# Patient Record
Sex: Female | Born: 1960 | Race: Black or African American | Hispanic: No | Marital: Married | Smoking: Never smoker
Health system: Southern US, Community
[De-identification: ages and names within clinical notes are randomized; demographics above are authoritative.]

## PROBLEM LIST (undated history)

## (undated) DIAGNOSIS — G6181 Chronic inflammatory demyelinating polyneuritis: Secondary | ICD-10-CM

## (undated) HISTORY — DX: Chronic inflammatory demyelinating polyneuritis: G61.81

---

## 2010-08-18 DIAGNOSIS — G6181 Chronic inflammatory demyelinating polyneuritis: Secondary | ICD-10-CM | POA: Insufficient documentation

## 2010-11-10 DIAGNOSIS — R209 Unspecified disturbances of skin sensation: Secondary | ICD-10-CM | POA: Insufficient documentation

## 2010-11-10 DIAGNOSIS — H532 Diplopia: Secondary | ICD-10-CM | POA: Insufficient documentation

## 2011-02-04 ENCOUNTER — Encounter: Payer: Self-pay | Admitting: Family Medicine

## 2011-02-04 ENCOUNTER — Ambulatory Visit (INDEPENDENT_AMBULATORY_CARE_PROVIDER_SITE_OTHER): Payer: BC Managed Care – PPO | Admitting: Family Medicine

## 2011-02-04 ENCOUNTER — Ambulatory Visit (HOSPITAL_BASED_OUTPATIENT_CLINIC_OR_DEPARTMENT_OTHER)
Admission: RE | Admit: 2011-02-04 | Discharge: 2011-02-04 | Disposition: A | Discharge status: Home / Self Care | Payer: BC Managed Care – PPO | Source: Ambulatory Visit | Attending: Family Medicine | Admitting: Family Medicine

## 2011-02-04 VITALS — BP 100/70 | HR 69 | Temp 98.3°F | Ht 66.0 in | Wt 160.0 lb

## 2011-02-04 DIAGNOSIS — M25572 Pain in left ankle and joints of left foot: Secondary | ICD-10-CM

## 2011-02-04 DIAGNOSIS — M25579 Pain in unspecified ankle and joints of unspecified foot: Secondary | ICD-10-CM

## 2011-02-04 DIAGNOSIS — X58XXXA Exposure to other specified factors, initial encounter: Secondary | ICD-10-CM

## 2011-02-04 DIAGNOSIS — S82899A Other fracture of unspecified lower leg, initial encounter for closed fracture: Secondary | ICD-10-CM

## 2011-02-04 DIAGNOSIS — S8253XA Displaced fracture of medial malleolus of unspecified tibia, initial encounter for closed fracture: Secondary | ICD-10-CM | POA: Insufficient documentation

## 2011-02-04 NOTE — Patient Instructions (Signed)
Your exam and x-rays are consistent with a small avulsion fracture of your medial malleolus (though an accessory ossicle is also a possibility). Start with cam walker for support when up and walking around. Ok to take this off to shower, ice ankle, and to sleep. No weight bearing lower extremity exercises for your left leg for at least the next 3 weeks.  No running, walking, biking, elliptical. Tylenol as needed for pain. Elevate above the level of your heart when possible to help with swelling. Ice the area 15 minutes at a time 3-4 times a day. Follow up with me in 3 weeks for repeat x-rays and exam.

## 2011-02-05 ENCOUNTER — Encounter: Payer: Self-pay | Admitting: Family Medicine

## 2011-02-05 DIAGNOSIS — M25572 Pain in left ankle and joints of left foot: Secondary | ICD-10-CM | POA: Insufficient documentation

## 2011-02-05 NOTE — Assessment & Plan Note (Signed)
x-rays repeated today show possible medial malleolus avulsion fracture.  She is tender at this location but does not recall any acute injury.  No evidence maisonneuve injury.  Given location, should heal well with conservative care - cam walker for support and immobilization and to prevent further injury.  Other possibility is this is an accessory ossicle though typically edges of accessory ossicles are smooth.  Will treat as a true fracture - immobilize for next 3 weeks when ambulatory.  Icing, tylenol/nsaids as needed.  Elevation.  F/u in 3 weeks.  Consider transition to PT or HEP at that time.  See instructions for further.

## 2011-02-05 NOTE — Progress Notes (Signed)
Subjective:    Patient ID: Hannah Bowen, female    DOB: 12/24/1960, 50 y.o.   MRN: [id number]  PCP: Tomma Lightning  HPI 50 yo F here for medial left ankle pain.  Patient denies any known injury. She notes she does have intermittent pains throughout her body that typically come and go related to her chronic inflammatory demyelinating polyneuropathy (seen by neurology, has PICC line and on IV prednisone q3weeks and has done IVIG treatments). About 3 weeks ago she noted to neurologist that she was having medial left ankle pain worse with walking. She thinks this started in early July. Associated with swelling but seemed to improve through July - then mentioned to her neurologist who did x-rays then they called her and told her she had an ankle fracture but received no further treatment (referred to ortho but appointment was for mid-September). Not taking meds or icing for this area. Was wrapping before. No prior injuries to this ankle that she is aware of.  Past Medical History  Diagnosis Date  . Chronic inflammatory demyelinating polyneuropathy     No current outpatient prescriptions on file prior to visit.    Past Surgical History  Procedure Date  . Cesarean section     No Known Allergies  History   Social History  . Marital Status: Married    Spouse Name: N/A    Number of Children: N/A  . Years of Education: N/A   Occupational History  . Not on file.   Social History Main Topics  . Smoking status: Never Smoker   . Smokeless tobacco: Not on file  . Alcohol Use: Not on file  . Drug Use: Not on file  . Sexually Active: Not on file   Other Topics Concern  . Not on file   Social History Narrative  . No narrative on file    Family History  Problem Relation Age of Onset  . Diabetes Father   . Heart attack Father   . Hyperlipidemia Father   . Hypertension Father   . Diabetes Brother   . Hyperlipidemia Brother   . Hypertension Brother     BP 100/70  Pulse  69  Temp(Src) 98.3 F (36.8 C) (Oral)  Ht 5\' 6"  (1.676 m)  Wt 160 lb (72.576 kg)  BMI 25.82 kg/m2  Review of Systems See HPI above.    Objective:   Physical Exam Gen: NAD L ankle: Mild swelling medial ankle.  No bruising, redness, bony deformity. TTP medial malleolus.  No lateral malleolus, base 5th, navicular, achilles, fibular head, other TTP about foot or ankle. FROM ankle with 5/5 strength all motions - has pain on internal rotation though. Negative ant drawer and talar tilt. Negative thompsons. 2+ dp pulse, < 3 sec cap refill.  R ankle: FROM without pain, swelling.    Assessment & Plan:  1. Left ankle pain - x-rays repeated today show possible medial malleolus avulsion fracture.  She is tender at this location but does not recall any acute injury.  No evidence maisonneuve injury.  Given location, should heal well with conservative care - cam walker for support and immobilization and to prevent further injury.  Other possibility is this is an accessory ossicle though typically edges of accessory ossicles are smooth.  Will treat as a true fracture - immobilize for next 3 weeks when ambulatory.  Icing, tylenol/nsaids as needed.  Elevation.  F/u in 3 weeks.  Consider transition to PT or HEP at that time.  See instructions for  further.

## 2011-02-27 ENCOUNTER — Ambulatory Visit (HOSPITAL_BASED_OUTPATIENT_CLINIC_OR_DEPARTMENT_OTHER)
Admission: RE | Admit: 2011-02-27 | Discharge: 2011-02-27 | Disposition: A | Discharge status: Home / Self Care | Payer: 59 | Source: Ambulatory Visit | Attending: Family Medicine | Admitting: Family Medicine

## 2011-02-27 ENCOUNTER — Ambulatory Visit (INDEPENDENT_AMBULATORY_CARE_PROVIDER_SITE_OTHER): Payer: 59 | Admitting: Family Medicine

## 2011-02-27 ENCOUNTER — Encounter: Payer: Self-pay | Admitting: Family Medicine

## 2011-02-27 ENCOUNTER — Ambulatory Visit: Payer: BC Managed Care – PPO | Admitting: Family Medicine

## 2011-02-27 VITALS — BP 107/67 | HR 86 | Temp 98.9°F | Ht 66.0 in | Wt 165.0 lb

## 2011-02-27 DIAGNOSIS — M25572 Pain in left ankle and joints of left foot: Secondary | ICD-10-CM

## 2011-02-27 DIAGNOSIS — M25579 Pain in unspecified ankle and joints of unspecified foot: Secondary | ICD-10-CM

## 2011-02-27 NOTE — Patient Instructions (Signed)
Your x-rays look the same as 3 weeks ago though you are clinically completely healed. This suggests the x-ray finding is an accessory ossicle. Your pain is then most likely from medial ankle sprain or inflammation between medial ankle and the accessory ossicle. Moving forward start theraband exercises and do daily for the next 6 weeks. Avoid heels and walking barefoot for next 4 weeks while you strengthen the muscles that support your ankle. You shouldn't need an ankle brace for the next 4-6 weeks given you're going to be doing straight line exercise (running, swimming). Be careful if you run on trails though. Follow up with me as needed.

## 2011-03-02 ENCOUNTER — Encounter: Payer: Self-pay | Admitting: Family Medicine

## 2011-03-02 NOTE — Assessment & Plan Note (Signed)
x-rays repeated today show no change in bony fragment at medial malleolus.  This suggests this is from an old injury or is an accessory ossicle (typically would have smooth contour if this was an accessory os though).  Her pain then was likely from irritation at this old fracture site or from medial ankle sprain.  Regardless her pain is completely resolved.  Discontinue cam walker.  Start ankle strengthening exercises with theraband.  Activities as tolerated.  Wear supportive shoes for next 2-4 weeks.  F/u prn.

## 2011-03-02 NOTE — Progress Notes (Signed)
Subjective:    Patient ID: Hannah Bowen, female    DOB: 09-23-1960, 50 y.o.   MRN: [id number]  PCP: Tomma Lightning  HPI  50 yo F here for f/u medial left ankle pain.  8/29: Patient denies any known injury. She notes she does have intermittent pains throughout her body that typically come and go related to her chronic inflammatory demyelinating polyneuropathy (seen by neurology, has PICC line and on IV prednisone q3weeks and has done IVIG treatments). About 3 weeks ago she noted to neurologist that she was having medial left ankle pain worse with walking. She thinks this started in early July. Associated with swelling but seemed to improve through July - then mentioned to her neurologist who did x-rays then they called her and told her she had an ankle fracture but received no further treatment (referred to ortho but appointment was for mid-September). Not taking meds or icing for this area. Was wrapping before. No prior injuries to this ankle that she is aware of.  9/21: Patient reports pain has completely resolved since last visit. Still using cam walker but will go about an hour at a time with it off at home. No longer taking tylenol, icing, elevating. No complaints. Past Medical History  Diagnosis Date  . Chronic inflammatory demyelinating polyneuropathy     Current Outpatient Prescriptions on File Prior to Visit  Medication Sig Dispense Refill  . chlorhexidine (PERIDEX) 0.12 % solution       . LYRICA 100 MG capsule       . predniSONE 5 MG/5ML solution Inject 1,000 mg into the vein every 21 ( twenty-one) days.          Past Surgical History  Procedure Date  . Cesarean section     No Known Allergies  History   Social History  . Marital Status: Married    Spouse Name: N/A    Number of Children: N/A  . Years of Education: N/A   Occupational History  . Not on file.   Social History Main Topics  . Smoking status: Never Smoker   . Smokeless tobacco: Not on file    . Alcohol Use: Not on file  . Drug Use: Not on file  . Sexually Active: Not on file   Other Topics Concern  . Not on file   Social History Narrative  . No narrative on file    Family History  Problem Relation Age of Onset  . Diabetes Father   . Heart attack Father   . Hyperlipidemia Father   . Hypertension Father   . Diabetes Brother   . Hyperlipidemia Brother   . Hypertension Brother     BP 107/67  Pulse 86  Temp(Src) 98.9 F (37.2 C) (Oral)  Ht 5\' 6"  (1.676 m)  Wt 165 lb (74.844 kg)  BMI 26.63 kg/m2  Review of Systems  See HPI above.    Objective:   Physical Exam  Gen: NAD L ankle: No swelling medial ankle.  No bruising, redness, bony deformity. No TTP medial malleolus.  No lateral malleolus, base 5th, navicular, achilles, fibular head, other TTP about foot or ankle. FROM ankle with 5/5 strength all motions. Negative ant drawer and talar tilt. Negative thompsons. 2+ dp pulse, < 3 sec cap refill.  R ankle: FROM without pain, swelling.    Assessment & Plan:  1. Left ankle pain - x-rays repeated today show no change in bony fragment at medial malleolus.  This suggests this is from an old injury  or is an accessory ossicle (typically would have smooth contour if this was an accessory os though).  Her pain then was likely from irritation at this old fracture site or from medial ankle sprain.  Regardless her pain is completely resolved.  Discontinue cam walker.  Start ankle strengthening exercises with theraband.  Activities as tolerated.  Wear supportive shoes for next 2-4 weeks.  F/u prn.

## 2011-03-09 DIAGNOSIS — Z79899 Other long term (current) drug therapy: Secondary | ICD-10-CM | POA: Insufficient documentation

## 2013-03-28 ENCOUNTER — Ambulatory Visit (INDEPENDENT_AMBULATORY_CARE_PROVIDER_SITE_OTHER): Payer: 59 | Admitting: Sports Medicine

## 2013-03-28 ENCOUNTER — Encounter: Payer: Self-pay | Admitting: Sports Medicine

## 2013-03-28 VITALS — BP 101/68 | HR 69 | Ht 66.0 in | Wt 145.0 lb

## 2013-03-28 DIAGNOSIS — R269 Unspecified abnormalities of gait and mobility: Secondary | ICD-10-CM

## 2013-03-28 DIAGNOSIS — M7750 Other enthesopathy of unspecified foot: Secondary | ICD-10-CM

## 2013-03-28 DIAGNOSIS — M7741 Metatarsalgia, right foot: Secondary | ICD-10-CM | POA: Insufficient documentation

## 2013-03-28 NOTE — Assessment & Plan Note (Signed)
Metatarsal pads on the right are comfortable but not on the left  Once she completes a marathon I may try to use a smaller metatarsal pad for the left  She should return and we will consider a custom orthotic but also place metatarsal pads in her regular shoes

## 2013-03-28 NOTE — Assessment & Plan Note (Signed)
We used sports insoles and placed scaphoid pad to give her better arch support  On the right foot a metatarsal pad was comfortable  Only left foot metatarsal pad was uncomfortable so we placed it on the undersurface  Her gait and comfort was improved when these were completed

## 2013-03-28 NOTE — Progress Notes (Signed)
Patient ID: Hannah Bowen, female   DOB: 02-Mar-1961, 52 y.o.   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 Does mission trips No known disease or trigger Was hospitalized  6 mos Parlyzed neck down  Started walking 11/2010 Not strong but progressively improved  Still always tingling in stocking distribution None in arms  Exercise program is running Doing [employer] marathon next weekend Normally runs 15 MPW  Physical Exam  NAD  Marked calluses transverse arch Rt > LT Has long arch loss and transverse arch Pronation is minimal 4th MT head down on Rt, calluses at 2-4 and base of 5th MT Kissing callus on dorsum of PIP on 4th toe bilaterally 3rd MT down on Lt, 5th subluxed Good great toe motion, early valgus shift of 1st toes bilat  Leg lengths equal Genu valgus bilat  Mild pronation with walking, more with running bilaterally  Hip abduction strong bilat

## 2013-03-28 NOTE — Patient Instructions (Signed)
Try correction in insoles- if the pad on the underneath side of the left green insole is bothersome, ok to remove it  Please follow up after marathon, bring additional pairs of shoes you wear most frequently for [place] to place padding  Thank you for seeing [place] today!  Good luck and have fun doing your marathon!

## 2014-02-08 DIAGNOSIS — M549 Dorsalgia, unspecified: Secondary | ICD-10-CM | POA: Insufficient documentation

## 2014-05-29 DIAGNOSIS — L249 Irritant contact dermatitis, unspecified cause: Secondary | ICD-10-CM | POA: Insufficient documentation

## 2015-06-07 DIAGNOSIS — E559 Vitamin D deficiency, unspecified: Secondary | ICD-10-CM | POA: Insufficient documentation

## 2018-12-15 ENCOUNTER — Encounter: Payer: Self-pay | Admitting: Neurology

## 2018-12-23 ENCOUNTER — Telehealth: Payer: Self-pay | Admitting: Neurology

## 2018-12-23 NOTE — Telephone Encounter (Signed)
error 

## 2018-12-26 ENCOUNTER — Other Ambulatory Visit (INDEPENDENT_AMBULATORY_CARE_PROVIDER_SITE_OTHER): Payer: 59

## 2018-12-26 ENCOUNTER — Encounter: Payer: Self-pay | Admitting: Neurology

## 2018-12-26 ENCOUNTER — Ambulatory Visit (INDEPENDENT_AMBULATORY_CARE_PROVIDER_SITE_OTHER): Payer: 59 | Admitting: Neurology

## 2018-12-26 ENCOUNTER — Other Ambulatory Visit: Payer: Self-pay

## 2018-12-26 VITALS — BP 131/76 | HR 95 | Ht 66.0 in | Wt 163.0 lb

## 2018-12-26 DIAGNOSIS — G6181 Chronic inflammatory demyelinating polyneuritis: Secondary | ICD-10-CM

## 2018-12-26 DIAGNOSIS — R202 Paresthesia of skin: Secondary | ICD-10-CM

## 2018-12-26 NOTE — Patient Instructions (Addendum)
Reduce prednisone by 20mg  every week, when you are on 20mg  daily, let me know and I will give you instructions to reduce it more slowly.  Check labs  NCS/EMG of the right arm and leg   ELECTROMYOGRAM AND NERVE CONDUCTION STUDIES (EMG/NCS) INSTRUCTIONS  How to Prepare The neurologist conducting the EMG will need to know if you have certain medical conditions. Tell the neurologist and other EMG lab personnel if you: . Have a pacemaker or any other electrical medical device . Take blood-thinning medications . Have hemophilia, a blood-clotting disorder that causes prolonged bleeding Bathing Take a shower or bath shortly before your exam in order to remove oils from your skin. Don't apply lotions or creams before the exam.  What to Expect You'll likely be asked to change into a hospital gown for the procedure and lie down on an examination table. The following explanations can help you understand what will happen during the exam.  . Electrodes. The neurologist or a technician places surface electrodes at various locations on your skin depending on where you're experiencing symptoms. Or the neurologist may insert needle electrodes at different sites depending on your symptoms.  . Sensations. The electrodes will at times transmit a tiny electrical current that you may feel as a twinge or spasm. The needle electrode may cause discomfort or pain that usually ends shortly after the needle is removed. If you are concerned about discomfort or pain, you may want to talk to the neurologist about taking a short break during the exam.  . Instructions. During the needle EMG, the neurologist will assess whether there is any spontaneous electrical activity when the muscle is at rest - activity that isn't present in healthy muscle tissue - and the degree of activity when you slightly contract the muscle.  He or she will give you instructions on resting and contracting a muscle at appropriate times. Depending on  what muscles and nerves the neurologist is examining, he or she may ask you to change positions during the exam.  After your EMG You may experience some temporary, minor bruising where the needle electrode was inserted into your muscle. This bruising should fade within several days. If it persists, contact your primary care doctor.   Your provider has requested that you have labwork completed today. Please go to [facility] ([id number]) on the second floor of this building before leaving the office today. You do not need to check in. If you are not called within 15 minutes please check with the front desk.

## 2018-12-26 NOTE — Progress Notes (Signed)
[facility] Clinic Note - Initial Visit   Date: 12/26/18  Hannah Bowen MRN: [id number] DOB: Aug 18, 1960   Dear Dr. Darron Doom:  Thank you for your kind referral of Hannah Bowen for consultation of CIDP. Although her history is well known to you, please allow Hannah Bowen to reiterate it for the purpose of our medical record. The patient was accompanied to the clinic by husband who also provides collateral information.     History of Present Illness: Hannah Bowen is a 58 y.o. right-handed female with history of CIDP (diagnosed 2011) presenting for evaluation of paresthesias.   In October 2011, she developed upper respiratory infection and few days later developed numbness and tingling in the fingers.  Over the following week, numbness and weakness involved her legs which prompted her to go to the emergency department.  She was seen at [facility] and diagnosed with AIDP via lumbar puncture (W2 R0 G77* P52*).  She was treated with IVIG and improved over a week.  However, a week following her discharge, she was readmitted with worsening weakness, requiring ICU admission due to severe weakness in the extremities and diplopia.  She did not have problems with speech, breathing, or autonomic instability.  She was then treated with plasmapheresis and discharged to inpatient rehab.  Over the following 2 months, she had 4 relapses and received a total of 2 cycles of IVIG and 3 cycles of plasmapheresis.  She underwent a second LP and muscle and left sural nerve biopsy which was consistent with CIDP.  In January 2012 -October 2012, she was started on maintenance therapy with Solu-Medrol. By June 2013, she was about 80% back to her baseline and she was able to start travelling again. Over the years, she has mild residual imbalance, generalized weakness, and mild paresthesias, but was able to get back to her normal level of activity.  She was a being followed by Dr. Desiree Hane, Neuromuscular  neurologist at [facility].  She moved to [place] in July 2012 and established care with Dr. Harvie Bridge at [facility].  She was doing well and did not have any further follow-up or treatment. For pain she has tried gabapentin, Lyrica, and amitriptyline and currently not taking anything.   Starting around June 2020, she began having increased ingling sensation of the feet, hands, and face.  She has significant fatigue.  She did not have any preceding infection. She walks unassisted and has not suffered any falls.  She has not done any recent physical. She teaches swimming lessons. She takes prednisone as "emergency".  She took a week long course in 2015, which was prescribed by her PCP.  Because of her new symptoms, she again started prednisone 33m/d for the past 20-days.  Despite taking this, she has not experienced any improvement in her paresthesias.       She is self-employed.  She is vegetarian. No recent illness.  [place]   Past Medical History:  Diagnosis Date   Chronic inflammatory demyelinating polyneuropathy ([facility]     Past Surgical History:  Procedure Laterality Date   CESAREAN SECTION       Medications:  Outpatient Encounter Medications as of 12/26/2018  Medication Sig   predniSONE (DELTASONE) 20 MG tablet Take 60 mg by mouth daily. Takes 651mdaily   No facility-administered encounter medications on file as of 12/26/2018.     Allergies: No Known Allergies  Family History: Family History  Problem Relation Age of Onset  Diabetes Father    Heart attack Father    Hyperlipidemia Father    Hypertension Father    Diabetes Brother    Hyperlipidemia Brother    Hypertension Brother     Social History: Social History   Tobacco Use   Smoking status: Never Smoker   Smokeless tobacco: Never Used  Substance Use Topics   Alcohol use: Not Currently   Drug use: Never   Social History   Social History Narrative   Right  handed, two story home, stairs, 6 total lives in her home    Review of Systems:  CONSTITUTIONAL: No fevers, chills, night sweats, or weight loss.   EYES: No visual changes or eye pain ENT: No hearing changes.  No history of nose bleeds.   RESPIRATORY: No cough, wheezing and shortness of breath.   CARDIOVASCULAR: Negative for chest pain, and palpitations.   GI: Negative for abdominal discomfort, blood in stools or black stools.  No recent change in bowel habits.   GU:  No history of incontinence.   MUSCLOSKELETAL: No history of joint pain or swelling.  No myalgias.   SKIN: Negative for lesions, rash, and itching.   HEMATOLOGY/ONCOLOGY: Negative for prolonged bleeding, bruising easily, and swollen nodes.  No history of cancer.   ENDOCRINE: Negative for cold or heat intolerance, polydipsia or goiter.   PSYCH:  No depression or anxiety symptoms.   NEURO: As Above.   Vital Signs:  BP 131/76    Pulse 95    Ht 5' 6"  (1.676 m)    Wt 163 lb (73.9 kg)    SpO2 97%    BMI 26.31 kg/m    General Medical Exam:   General:  Well appearing, comfortable.   Eyes/ENT: see cranial nerve examination.   Neck:   No carotid bruits. Respiratory:  Clear to auscultation, good air entry bilaterally.   Cardiac:  Regular rate and rhythm, no murmur.   Extremities:  No deformities, edema, or skin discoloration.  Skin:  No rashes or lesions. Scar over the distal left leg/foot from prior muscle and sural nerve biopsy.  Neurological Exam: MENTAL STATUS including orientation to time, place, person, recent and remote memory, attention span and concentration, language, and fund of knowledge is normal.  Speech is not dysarthric.  CRANIAL NERVES: II:  No visual field defects.   III-IV-VI: Pupils equal round and reactive to light.  Normal conjugate, extra-ocular eye movements in all directions of gaze.  No nystagmus.  No ptosis.   V:  Normal facial sensation.    VII:  Normal facial symmetry and movements.   VIII:   Normal hearing and vestibular function.   IX-X:  Normal palatal movement.   XI:  Normal shoulder shrug and head rotation.   XII:  Normal tongue strength and range of motion, no deviation or fasciculation.  MOTOR:  No atrophy, fasciculations or abnormal movements.  No pronator drift.   Upper Extremity:  Right  Left  Deltoid  5/5   5/5   Biceps  5/5   5/5   Triceps  5/5   5/5   Infraspinatus 5/5  5/5  Medial pectoralis 5/5  5/5  Wrist extensors  5/5   5/5   Wrist flexors  5/5   5/5   Finger extensors  5/5   5/5   Finger flexors  5/5   5/5   Dorsal interossei  5/5   5/5   Abductor pollicis  5/5   5/5   Tone (Ashworth scale)  0  0   Lower Extremity:  Right  Left  Hip flexors  5/5   5/5   Hip extensors  5/5   5/5   Adductor 5/5  5/5  Abductor 5/5  5/5  Knee flexors  5/5   5/5   Knee extensors  5/5   5/5   Dorsiflexors  5/5   5/5   Plantarflexors  5/5   5/5   Toe extensors  5/5   5/5   Toe flexors  5/5   5/5   Tone (Ashworth scale)  0  0   MSRs:  Right        Left                  brachioradialis 2+  2+  biceps 2+  2+  triceps 2+  2+  patellar tr  tr  ankle jerk 0  0  Hoffman no  no  plantar response down  down   SENSORY:  Normal and symmetric perception of light touch, pinprick, vibration, and proprioception.  Mild sway with Romberg's sign.   COORDINATION/GAIT: Normal finger-to- nose-finger and heel-to-shin.  Intact rapid alternating movements bilaterally. Gait narrow based and stable. Stressed gait intact. Mild unsteadiness with tandem gait.    IMPRESSION: Chronic inflammatory demyelinating polyradiculoneuropathy, diagnosed in 2011, and treated with series of IVIG (minimal benefit), plasmapheresis (some benefit), and Solumedrol (greatest benefit).  She was on maintenance IVMP from Jan 2012 - Oct 2012 and has made an impressive recovery.  Over the past month, she began experiencing generalized numbness/tingling concerning for relapse of CIDP and was started on prednisone  51m/d. She has not appreciated any significant benefit.   Her exam today is remarkably intact, except for absent reflexes in the legs and mild sensory ataxia.  Strength and sensation is normal throughout. I will plan to obtain NCS/EMG of the right arm and leg to assess for demyelinating changes.    Management options were discussed and I do not favor treating CIDP with prednisone and will taper her off this by reducing by 235mevery week, and slowed once she is on 2079m.  Instead, if there is evidence of demyelinating polyradiculoneuropathy on EDX, I will start monthly methylprednisolone.  She will also be screened for other causes of paresthesias with laboratory testing as follows:  ESR, CRP, vitamin B12, folate, vitamin B1, MMA, copper, TSH, SPEP with IFE, and CMP.  Further recommendations pending results.  Greater than 50% of this 60 minute visit was spent in counseling, explanation of diagnosis, planning of further management, and coordination of care due to the nature of their.   Thank you for allowing me to participate in patient's care.  If I can answer any additional questions, I would be pleased to do so.    Sincerely,    Cleavon Goldman K. PatPosey ProntoO

## 2018-12-27 ENCOUNTER — Other Ambulatory Visit: Payer: Self-pay

## 2018-12-27 ENCOUNTER — Telehealth: Payer: Self-pay

## 2018-12-27 DIAGNOSIS — R202 Paresthesia of skin: Secondary | ICD-10-CM

## 2018-12-27 DIAGNOSIS — G6181 Chronic inflammatory demyelinating polyneuritis: Secondary | ICD-10-CM

## 2018-12-27 LAB — B12 AND FOLATE PANEL
Folate: 9.9 ng/mL
Vitamin B-12: 381 pg/mL (ref 200–1100)

## 2018-12-27 NOTE — Telephone Encounter (Signed)
-----   Message from Alda Berthold, DO sent at 12/27/2018  9:34 AM EDT ----- Please call the lab and see if they can add free T3 and free T4 to her labs from yesterday. Thanks.

## 2018-12-27 NOTE — Telephone Encounter (Signed)
Added labs to exisiting order

## 2018-12-31 LAB — COMPLETE METABOLIC PANEL WITH GFR
AG Ratio: 1.3 (calc) (ref 1.0–2.5)
ALT: 20 U/L (ref 6–29)
AST: 11 U/L (ref 10–35)
Albumin: 4.2 g/dL (ref 3.6–5.1)
Alkaline phosphatase (APISO): 51 U/L (ref 37–153)
BUN: 13 mg/dL (ref 7–25)
CO2: 25 mmol/L (ref 20–32)
Calcium: 9.5 mg/dL (ref 8.6–10.4)
Chloride: 104 mmol/L (ref 98–110)
Creat: 0.78 mg/dL (ref 0.50–1.05)
GFR, Est African American: 97 mL/min/{1.73_m2} (ref >=60)
GFR, Est Non African American: 84 mL/min/{1.73_m2} (ref >=60)
Globulin: 3.2 g/dL (calc) (ref 1.9–3.7)
Glucose, Bld: 132 mg/dL — ABNORMAL HIGH (ref 65–99)
Potassium: 4.1 mmol/L (ref 3.5–5.3)
Sodium: 140 mmol/L (ref 135–146)
Total Bilirubin: 0.3 mg/dL (ref 0.2–1.2)
Total Protein: 7.4 g/dL (ref 6.1–8.1)

## 2018-12-31 LAB — IMMUNOFIXATION ELECTROPHORESIS
IgG (Immunoglobin G), Serum: 1471 mg/dL (ref 600–1640)
IgM, Serum: 34 mg/dL — ABNORMAL LOW (ref 50–300)
Immunofix Electr Int: NOT DETECTED
Immunoglobulin A: 195 mg/dL (ref 47–310)

## 2018-12-31 LAB — COPPER, SERUM: Copper: 119 ug/dL (ref 70–175)

## 2018-12-31 LAB — PROTEIN ELECTROPHORESIS, SERUM
Albumin ELP: 4.1 g/dL (ref 3.8–4.8)
Alpha 1: 0.3 g/dL (ref 0.2–0.3)
Alpha 2: 0.8 g/dL (ref 0.5–0.9)
Beta 2: 0.4 g/dL (ref 0.2–0.5)
Beta Globulin: 0.5 g/dL (ref 0.4–0.6)
Gamma Globulin: 1.4 g/dL (ref 0.8–1.7)
Total Protein: 7.4 g/dL (ref 6.1–8.1)

## 2018-12-31 LAB — TEST AUTHORIZATION

## 2018-12-31 LAB — VITAMIN B1

## 2018-12-31 LAB — T3, FREE: T3, Free: 2.6 pg/mL (ref 2.3–4.2)

## 2018-12-31 LAB — SEDIMENTATION RATE: Sed Rate: 19 mm/h (ref 0–30)

## 2018-12-31 LAB — METHYLMALONIC ACID, SERUM: Methylmalonic Acid, Quant: 176 nmol/L (ref 87–318)

## 2018-12-31 LAB — T4, FREE: Free T4: 1.1 ng/dL (ref 0.8–1.8)

## 2018-12-31 LAB — TSH: TSH: 0.16 mIU/L — ABNORMAL LOW (ref 0.40–4.50)

## 2018-12-31 LAB — C-REACTIVE PROTEIN: CRP: 0.7 mg/L (ref <8.0)

## 2019-01-03 NOTE — Telephone Encounter (Signed)
Plan to start Solumedrol 1g x 5 days, followed by monthly infusions.

## 2019-01-04 ENCOUNTER — Other Ambulatory Visit: Payer: Self-pay

## 2019-01-04 DIAGNOSIS — G6181 Chronic inflammatory demyelinating polyneuritis: Secondary | ICD-10-CM

## 2019-01-04 LAB — VITAMIN B1: Vitamin B1 (Thiamine): 21 nmol/L (ref 8–30)

## 2019-01-04 NOTE — Progress Notes (Signed)
Patient is scheduled for Monday 01/09/2019 for treatment for solumedrol orders at patient care center [phone], patient notified of this and orders placed

## 2019-01-09 ENCOUNTER — Ambulatory Visit (HOSPITAL_COMMUNITY)
Admission: RE | Admit: 2019-01-09 | Discharge: 2019-01-09 | Disposition: A | Discharge status: Home / Self Care | Payer: 59 | Source: Ambulatory Visit | Attending: Internal Medicine | Admitting: Internal Medicine

## 2019-01-09 ENCOUNTER — Other Ambulatory Visit: Payer: Self-pay

## 2019-01-09 DIAGNOSIS — G6181 Chronic inflammatory demyelinating polyneuritis: Secondary | ICD-10-CM | POA: Diagnosis not present

## 2019-01-09 MED ORDER — SODIUM CHLORIDE 0.9 % IV SOLN
1000.0000 mg | Freq: Every day | INTRAVENOUS | Status: DC
  Administered 2019-01-09: 1000 mg via INTRAVENOUS
  Filled 2019-01-09: qty 1000

## 2019-01-09 MED ORDER — SODIUM CHLORIDE 0.9 % IV SOLN
1000.0000 mg | INTRAVENOUS | Status: DC
  Filled 2019-01-09: qty 1000

## 2019-01-09 MED ORDER — SODIUM CHLORIDE 0.9 % IV SOLN
INTRAVENOUS | Status: DC | PRN
  Administered 2019-01-09: 10:00:00 250 mL via INTRAVENOUS

## 2019-01-09 MED ORDER — SODIUM CHLORIDE 0.9 % IV SOLN: 1000.0000 mg | Freq: Every day | INTRAVENOUS | Status: DC

## 2019-01-09 NOTE — Progress Notes (Signed)
Placed new orders for solu-medrol 1g. Contacted Patient care.

## 2019-01-09 NOTE — Discharge Instructions (Signed)
Methylprednisolone Solution for Injection What is this medicine? METHYLPREDNISOLONE (meth ill pred NISS oh lone) is a corticosteroid. It is commonly used to treat inflammation of the skin, joints, lungs, and other organs. Common conditions treated include asthma, allergies, and arthritis. It is also used for other conditions, such as blood disorders and diseases of the adrenal glands. This medicine may be used for other purposes; ask your health care provider or pharmacist if you have questions. COMMON BRAND NAME(S): A-Methapred, Solu-Medrol What should I tell my health care provider before I take this medicine? They need to know if you have any of these conditions:  Cushing's syndrome  eye disease, vision problems  diabetes  glaucoma  heart disease  high blood pressure  infection (especially a virus infection such as chickenpox, cold sores, or herpes)  liver disease  mental illness  myasthenia gravis  osteoporosis  recently received or scheduled to receive a vaccine  seizures  stomach or intestine problems  thyroid disease  an unusual or allergic reaction to lactose, methylprednisolone, other medicines, foods, dyes, or preservatives  pregnant or trying to get pregnant  breast-feeding How should I use this medicine? This medicine is for injection or infusion into a vein. It is also for injection into a muscle. It is given by a health care professional in a hospital or clinic setting. Talk to your pediatrician regarding the use of this medicine in children. While this drug may be prescribed for selected conditions, precautions do apply. Overdosage: If you think you have taken too much of this medicine contact a poison control center or emergency room at once. NOTE: This medicine is only for you. Do not share this medicine with others. What if I miss a dose? This does not apply. What may interact with this medicine? Do not take this medicine with any of the following  medications:  alefacept  echinacea  iopamidol  live virus vaccines  metyrapone  mifepristone This medicine may also interact with the following medications:  amphotericin B  aspirin and aspirin-like medicines  certain antibiotics like erythromycin, clarithromycin, troleandomycin  certain medicines for diabetes  certain medicines for fungal infection like ketoconazole  certain medicines for seizures like carbamazepine, phenobarbital, phenytoin  certain medicines that treat or prevent blood clots like warfarin  cyclosporine  digoxin  diuretics  female hormones, like estrogens and birth control pills  isoniazid  NSAIDS, medicines for pain and inflammation, like ibuprofen or naproxen  other medicines for myasthenia gravis  rifampin  vaccines This list may not describe all possible interactions. Give your health care provider a list of all the medicines, herbs, non-prescription drugs, or dietary supplements you use. Also tell them if you smoke, drink alcohol, or use illegal drugs. Some items may interact with your medicine. What should I watch for while using this medicine? Tell your doctor or healthcare professional if your symptoms do not start to get better or if they get worse. Do not stop taking except on your doctor's advice. You may develop a severe reaction. Your doctor will tell you how much medicine to take. Your condition will be monitored carefully while you are receiving this medicine. This medicine may increase your risk of getting an infection. Tell your doctor or health care professional if you are around anyone with measles or chickenpox, or if you develop sores or blisters that do not heal properly. This medicine may increase blood sugar. Ask your healthcare provider if changes in diet or medicines are needed if you have diabetes. Tell   your doctor or health care professional right away if you have any change in your eyesight. Using this medicine for a  long time may increase your risk of low bone mass. Talk to your doctor about bone health. What side effects may I notice from receiving this medicine? Side effects that you should report to your doctor or health care professional as soon as possible:  allergic reactions like skin rash, itching or hives, swelling of the face, lips, or tongue  bloody or tarry stools  hallucination, loss of contact with reality  muscle cramps  muscle pain  palpitations  signs and symptoms of high blood sugar such as being more thirsty or hungry or having to urinate more than normal. You may also feel very tired or have blurry vision.  signs and symptoms of infection like fever or chills; cough; sore throat; pain or trouble passing urine  trouble passing urine Side effects that usually do not require medical attention (report to your doctor or health care professional if they continue or are bothersome):  changes in emotions or mood  constipation  diarrhea  excessive hair growth on the face or body  headache  nausea, vomiting  pain, redness, or irritation at site where injected  trouble sleeping  weight gain This list may not describe all possible side effects. Call your doctor for medical advice about side effects. You may report side effects to FDA at 1-800-FDA-1088. Where should I keep my medicine? This drug is given in a hospital or clinic and will not be stored at home. NOTE: This sheet is a summary. It may not cover all possible information. If you have questions about this medicine, talk to your doctor, pharmacist, or health care provider.  2020 Elsevier/Gold Standard (2018-02-24 09:12:19)  

## 2019-01-09 NOTE — Progress Notes (Signed)
Patient received methyl Predinsolone (Solumedrol) via  PIV. Rate per hour was reduced per patient request ( 71ml /hr). Tolerated well, vitals stable, discharge instructions given, verbalized understanding. Patient alert, oriented and ambulatory at the time of discharge.

## 2019-01-10 ENCOUNTER — Ambulatory Visit (HOSPITAL_COMMUNITY)
Admission: RE | Admit: 2019-01-10 | Discharge: 2019-01-10 | Disposition: A | Discharge status: Home / Self Care | Payer: 59 | Source: Ambulatory Visit | Attending: Internal Medicine | Admitting: Internal Medicine

## 2019-01-10 DIAGNOSIS — G6181 Chronic inflammatory demyelinating polyneuritis: Secondary | ICD-10-CM | POA: Diagnosis not present

## 2019-01-10 MED ORDER — SODIUM CHLORIDE 0.9 % IV SOLN
1000.0000 mg | Freq: Every day | INTRAVENOUS | Status: DC
  Filled 2019-01-10: qty 8

## 2019-01-10 MED ORDER — SODIUM CHLORIDE 0.9 % IV SOLN
1000.0000 mg | Freq: Every day | INTRAVENOUS | Status: DC
  Administered 2019-01-10: 1000 mg via INTRAVENOUS
  Filled 2019-01-10 (×2): qty 8

## 2019-01-10 MED ORDER — SODIUM CHLORIDE 0.9 % IV SOLN
INTRAVENOUS | Status: DC | PRN
  Administered 2019-01-10: 09:00:00 250 mL via INTRAVENOUS

## 2019-01-10 NOTE — Progress Notes (Signed)
Patient received methyl Predninsolone (Solu-medrol) via PIV. Tolerated well, vitals stable, discharge instructions given, verbalized understanding. Patient alert, oriented and ambulatory at the time of discharge.

## 2019-01-10 NOTE — Discharge Instructions (Signed)
Methylprednisolone Solution for Injection What is this medicine? METHYLPREDNISOLONE (meth ill pred NISS oh lone) is a corticosteroid. It is commonly used to treat inflammation of the skin, joints, lungs, and other organs. Common conditions treated include asthma, allergies, and arthritis. It is also used for other conditions, such as blood disorders and diseases of the adrenal glands. This medicine may be used for other purposes; ask your health care provider or pharmacist if you have questions. COMMON BRAND NAME(S): A-Methapred, Solu-Medrol What should I tell my health care provider before I take this medicine? They need to know if you have any of these conditions:  Cushing's syndrome  eye disease, vision problems  diabetes  glaucoma  heart disease  high blood pressure  infection (especially a virus infection such as chickenpox, cold sores, or herpes)  liver disease  mental illness  myasthenia gravis  osteoporosis  recently received or scheduled to receive a vaccine  seizures  stomach or intestine problems  thyroid disease  an unusual or allergic reaction to lactose, methylprednisolone, other medicines, foods, dyes, or preservatives  pregnant or trying to get pregnant  breast-feeding How should I use this medicine? This medicine is for injection or infusion into a vein. It is also for injection into a muscle. It is given by a health care professional in a hospital or clinic setting. Talk to your pediatrician regarding the use of this medicine in children. While this drug may be prescribed for selected conditions, precautions do apply. Overdosage: If you think you have taken too much of this medicine contact a poison control center or emergency room at once. NOTE: This medicine is only for you. Do not share this medicine with others. What if I miss a dose? This does not apply. What may interact with this medicine? Do not take this medicine with any of the following  medications:  alefacept  echinacea  iopamidol  live virus vaccines  metyrapone  mifepristone This medicine may also interact with the following medications:  amphotericin B  aspirin and aspirin-like medicines  certain antibiotics like erythromycin, clarithromycin, troleandomycin  certain medicines for diabetes  certain medicines for fungal infection like ketoconazole  certain medicines for seizures like carbamazepine, phenobarbital, phenytoin  certain medicines that treat or prevent blood clots like warfarin  cyclosporine  digoxin  diuretics  female hormones, like estrogens and birth control pills  isoniazid  NSAIDS, medicines for pain and inflammation, like ibuprofen or naproxen  other medicines for myasthenia gravis  rifampin  vaccines This list may not describe all possible interactions. Give your health care provider a list of all the medicines, herbs, non-prescription drugs, or dietary supplements you use. Also tell them if you smoke, drink alcohol, or use illegal drugs. Some items may interact with your medicine. What should I watch for while using this medicine? Tell your doctor or healthcare professional if your symptoms do not start to get better or if they get worse. Do not stop taking except on your doctor's advice. You may develop a severe reaction. Your doctor will tell you how much medicine to take. Your condition will be monitored carefully while you are receiving this medicine. This medicine may increase your risk of getting an infection. Tell your doctor or health care professional if you are around anyone with measles or chickenpox, or if you develop sores or blisters that do not heal properly. This medicine may increase blood sugar. Ask your healthcare provider if changes in diet or medicines are needed if you have diabetes. Tell   your doctor or health care professional right away if you have any change in your eyesight. Using this medicine for a  long time may increase your risk of low bone mass. Talk to your doctor about bone health. What side effects may I notice from receiving this medicine? Side effects that you should report to your doctor or health care professional as soon as possible:  allergic reactions like skin rash, itching or hives, swelling of the face, lips, or tongue  bloody or tarry stools  hallucination, loss of contact with reality  muscle cramps  muscle pain  palpitations  signs and symptoms of high blood sugar such as being more thirsty or hungry or having to urinate more than normal. You may also feel very tired or have blurry vision.  signs and symptoms of infection like fever or chills; cough; sore throat; pain or trouble passing urine  trouble passing urine Side effects that usually do not require medical attention (report to your doctor or health care professional if they continue or are bothersome):  changes in emotions or mood  constipation  diarrhea  excessive hair growth on the face or body  headache  nausea, vomiting  pain, redness, or irritation at site where injected  trouble sleeping  weight gain This list may not describe all possible side effects. Call your doctor for medical advice about side effects. You may report side effects to FDA at 1-800-FDA-1088. Where should I keep my medicine? This drug is given in a hospital or clinic and will not be stored at home. NOTE: This sheet is a summary. It may not cover all possible information. If you have questions about this medicine, talk to your doctor, pharmacist, or health care provider.  2020 Elsevier/Gold Standard (2018-02-24 09:12:19)  

## 2019-01-11 ENCOUNTER — Other Ambulatory Visit: Payer: Self-pay

## 2019-01-11 ENCOUNTER — Ambulatory Visit (HOSPITAL_COMMUNITY)
Admission: RE | Admit: 2019-01-11 | Discharge: 2019-01-11 | Disposition: A | Discharge status: Home / Self Care | Payer: 59 | Source: Ambulatory Visit | Attending: Internal Medicine | Admitting: Internal Medicine

## 2019-01-11 DIAGNOSIS — G6181 Chronic inflammatory demyelinating polyneuritis: Secondary | ICD-10-CM | POA: Diagnosis not present

## 2019-01-11 MED ORDER — SODIUM CHLORIDE 0.9 % IV SOLN: 1000.0000 mg | Freq: Once | INTRAVENOUS | Status: DC

## 2019-01-11 MED ORDER — SODIUM CHLORIDE 0.9 % IV SOLN
INTRAVENOUS | Status: DC | PRN
  Administered 2019-01-11: 250 mL via INTRAVENOUS

## 2019-01-11 MED ORDER — SODIUM CHLORIDE 0.9 % IV SOLN
1000.0000 mg | Freq: Once | INTRAVENOUS | Status: AC
  Administered 2019-01-11: 1000 mg via INTRAVENOUS
  Filled 2019-01-11: qty 8

## 2019-01-11 NOTE — Progress Notes (Signed)
Patient received methyl predninsolone (Solu-medrol) via PIV. Tolerated well, vitals stable, discharge instructions given, verbalized understanding. Patient alert, oriented and ambulatory at the time of discharge.

## 2019-01-11 NOTE — Discharge Instructions (Signed)
Methylprednisolone Solution for Injection What is this medicine? METHYLPREDNISOLONE (meth ill pred NISS oh lone) is a corticosteroid. It is commonly used to treat inflammation of the skin, joints, lungs, and other organs. Common conditions treated include asthma, allergies, and arthritis. It is also used for other conditions, such as blood disorders and diseases of the adrenal glands. This medicine may be used for other purposes; ask your health care provider or pharmacist if you have questions. COMMON BRAND NAME(S): A-Methapred, Solu-Medrol What should I tell my health care provider before I take this medicine? They need to know if you have any of these conditions:  Cushing's syndrome  eye disease, vision problems  diabetes  glaucoma  heart disease  high blood pressure  infection (especially a virus infection such as chickenpox, cold sores, or herpes)  liver disease  mental illness  myasthenia gravis  osteoporosis  recently received or scheduled to receive a vaccine  seizures  stomach or intestine problems  thyroid disease  an unusual or allergic reaction to lactose, methylprednisolone, other medicines, foods, dyes, or preservatives  pregnant or trying to get pregnant  breast-feeding How should I use this medicine? This medicine is for injection or infusion into a vein. It is also for injection into a muscle. It is given by a health care professional in a hospital or clinic setting. Talk to your pediatrician regarding the use of this medicine in children. While this drug may be prescribed for selected conditions, precautions do apply. Overdosage: If you think you have taken too much of this medicine contact a poison control center or emergency room at once. NOTE: This medicine is only for you. Do not share this medicine with others. What if I miss a dose? This does not apply. What may interact with this medicine? Do not take this medicine with any of the following  medications:  alefacept  echinacea  iopamidol  live virus vaccines  metyrapone  mifepristone This medicine may also interact with the following medications:  amphotericin B  aspirin and aspirin-like medicines  certain antibiotics like erythromycin, clarithromycin, troleandomycin  certain medicines for diabetes  certain medicines for fungal infection like ketoconazole  certain medicines for seizures like carbamazepine, phenobarbital, phenytoin  certain medicines that treat or prevent blood clots like warfarin  cyclosporine  digoxin  diuretics  female hormones, like estrogens and birth control pills  isoniazid  NSAIDS, medicines for pain and inflammation, like ibuprofen or naproxen  other medicines for myasthenia gravis  rifampin  vaccines This list may not describe all possible interactions. Give your health care provider a list of all the medicines, herbs, non-prescription drugs, or dietary supplements you use. Also tell them if you smoke, drink alcohol, or use illegal drugs. Some items may interact with your medicine. What should I watch for while using this medicine? Tell your doctor or healthcare professional if your symptoms do not start to get better or if they get worse. Do not stop taking except on your doctor's advice. You may develop a severe reaction. Your doctor will tell you how much medicine to take. Your condition will be monitored carefully while you are receiving this medicine. This medicine may increase your risk of getting an infection. Tell your doctor or health care professional if you are around anyone with measles or chickenpox, or if you develop sores or blisters that do not heal properly. This medicine may increase blood sugar. Ask your healthcare provider if changes in diet or medicines are needed if you have diabetes. Tell   your doctor or health care professional right away if you have any change in your eyesight. Using this medicine for a  long time may increase your risk of low bone mass. Talk to your doctor about bone health. What side effects may I notice from receiving this medicine? Side effects that you should report to your doctor or health care professional as soon as possible:  allergic reactions like skin rash, itching or hives, swelling of the face, lips, or tongue  bloody or tarry stools  hallucination, loss of contact with reality  muscle cramps  muscle pain  palpitations  signs and symptoms of high blood sugar such as being more thirsty or hungry or having to urinate more than normal. You may also feel very tired or have blurry vision.  signs and symptoms of infection like fever or chills; cough; sore throat; pain or trouble passing urine  trouble passing urine Side effects that usually do not require medical attention (report to your doctor or health care professional if they continue or are bothersome):  changes in emotions or mood  constipation  diarrhea  excessive hair growth on the face or body  headache  nausea, vomiting  pain, redness, or irritation at site where injected  trouble sleeping  weight gain This list may not describe all possible side effects. Call your doctor for medical advice about side effects. You may report side effects to FDA at 1-800-FDA-1088. Where should I keep my medicine? This drug is given in a hospital or clinic and will not be stored at home. NOTE: This sheet is a summary. It may not cover all possible information. If you have questions about this medicine, talk to your doctor, pharmacist, or health care provider.  2020 Elsevier/Gold Standard (2018-02-24 09:12:19)  

## 2019-01-12 ENCOUNTER — Ambulatory Visit (HOSPITAL_COMMUNITY)
Admission: RE | Admit: 2019-01-12 | Discharge: 2019-01-12 | Disposition: A | Discharge status: Home / Self Care | Payer: 59 | Source: Ambulatory Visit | Attending: Neurology | Admitting: Neurology

## 2019-01-12 DIAGNOSIS — G6181 Chronic inflammatory demyelinating polyneuritis: Secondary | ICD-10-CM | POA: Diagnosis not present

## 2019-01-12 MED ORDER — SODIUM CHLORIDE 0.9 % IV SOLN
INTRAVENOUS | Status: DC | PRN
  Administered 2019-01-12: 250 mL via INTRAVENOUS

## 2019-01-12 MED ORDER — SODIUM CHLORIDE 0.9 % IV SOLN
1000.0000 mg | Freq: Once | INTRAVENOUS | Status: AC
  Administered 2019-01-12: 1000 mg via INTRAVENOUS
  Filled 2019-01-12: qty 8

## 2019-01-12 NOTE — Progress Notes (Signed)
Patient received Solu-medrol via PIV.Tolerated well, vitals stable, discharge instructions given, verbalized understanding. Patient alert, oriented and ambulatory at the time of discharge.  

## 2019-01-12 NOTE — Discharge Instructions (Signed)
Methylprednisolone Solution for Injection What is this medicine? METHYLPREDNISOLONE (meth ill pred NISS oh lone) is a corticosteroid. It is commonly used to treat inflammation of the skin, joints, lungs, and other organs. Common conditions treated include asthma, allergies, and arthritis. It is also used for other conditions, such as blood disorders and diseases of the adrenal glands. This medicine may be used for other purposes; ask your health care provider or pharmacist if you have questions. COMMON BRAND NAME(S): A-Methapred, Solu-Medrol What should I tell my health care provider before I take this medicine? They need to know if you have any of these conditions:  Cushing's syndrome  eye disease, vision problems  diabetes  glaucoma  heart disease  high blood pressure  infection (especially a virus infection such as chickenpox, cold sores, or herpes)  liver disease  mental illness  myasthenia gravis  osteoporosis  recently received or scheduled to receive a vaccine  seizures  stomach or intestine problems  thyroid disease  an unusual or allergic reaction to lactose, methylprednisolone, other medicines, foods, dyes, or preservatives  pregnant or trying to get pregnant  breast-feeding How should I use this medicine? This medicine is for injection or infusion into a vein. It is also for injection into a muscle. It is given by a health care professional in a hospital or clinic setting. Talk to your pediatrician regarding the use of this medicine in children. While this drug may be prescribed for selected conditions, precautions do apply. Overdosage: If you think you have taken too much of this medicine contact a poison control center or emergency room at once. NOTE: This medicine is only for you. Do not share this medicine with others. What if I miss a dose? This does not apply. What may interact with this medicine? Do not take this medicine with any of the following  medications:  alefacept  echinacea  iopamidol  live virus vaccines  metyrapone  mifepristone This medicine may also interact with the following medications:  amphotericin B  aspirin and aspirin-like medicines  certain antibiotics like erythromycin, clarithromycin, troleandomycin  certain medicines for diabetes  certain medicines for fungal infection like ketoconazole  certain medicines for seizures like carbamazepine, phenobarbital, phenytoin  certain medicines that treat or prevent blood clots like warfarin  cyclosporine  digoxin  diuretics  female hormones, like estrogens and birth control pills  isoniazid  NSAIDS, medicines for pain and inflammation, like ibuprofen or naproxen  other medicines for myasthenia gravis  rifampin  vaccines This list may not describe all possible interactions. Give your health care provider a list of all the medicines, herbs, non-prescription drugs, or dietary supplements you use. Also tell them if you smoke, drink alcohol, or use illegal drugs. Some items may interact with your medicine. What should I watch for while using this medicine? Tell your doctor or healthcare professional if your symptoms do not start to get better or if they get worse. Do not stop taking except on your doctor's advice. You may develop a severe reaction. Your doctor will tell you how much medicine to take. Your condition will be monitored carefully while you are receiving this medicine. This medicine may increase your risk of getting an infection. Tell your doctor or health care professional if you are around anyone with measles or chickenpox, or if you develop sores or blisters that do not heal properly. This medicine may increase blood sugar. Ask your healthcare provider if changes in diet or medicines are needed if you have diabetes. Tell   your doctor or health care professional right away if you have any change in your eyesight. Using this medicine for a  long time may increase your risk of low bone mass. Talk to your doctor about bone health. What side effects may I notice from receiving this medicine? Side effects that you should report to your doctor or health care professional as soon as possible:  allergic reactions like skin rash, itching or hives, swelling of the face, lips, or tongue  bloody or tarry stools  hallucination, loss of contact with reality  muscle cramps  muscle pain  palpitations  signs and symptoms of high blood sugar such as being more thirsty or hungry or having to urinate more than normal. You may also feel very tired or have blurry vision.  signs and symptoms of infection like fever or chills; cough; sore throat; pain or trouble passing urine  trouble passing urine Side effects that usually do not require medical attention (report to your doctor or health care professional if they continue or are bothersome):  changes in emotions or mood  constipation  diarrhea  excessive hair growth on the face or body  headache  nausea, vomiting  pain, redness, or irritation at site where injected  trouble sleeping  weight gain This list may not describe all possible side effects. Call your doctor for medical advice about side effects. You may report side effects to FDA at 1-800-FDA-1088. Where should I keep my medicine? This drug is given in a hospital or clinic and will not be stored at home. NOTE: This sheet is a summary. It may not cover all possible information. If you have questions about this medicine, talk to your doctor, pharmacist, or health care provider.  2020 Elsevier/Gold Standard (2018-02-24 09:12:19)  

## 2019-01-13 ENCOUNTER — Ambulatory Visit (HOSPITAL_COMMUNITY)
Admission: RE | Admit: 2019-01-13 | Discharge: 2019-01-13 | Disposition: A | Discharge status: Home / Self Care | Payer: 59 | Source: Ambulatory Visit | Attending: Neurology | Admitting: Neurology

## 2019-01-13 ENCOUNTER — Other Ambulatory Visit: Payer: Self-pay

## 2019-01-13 DIAGNOSIS — G6181 Chronic inflammatory demyelinating polyneuritis: Secondary | ICD-10-CM | POA: Diagnosis not present

## 2019-01-13 MED ORDER — SODIUM CHLORIDE 0.9 % IV SOLN
1000.0000 mg | Freq: Once | INTRAVENOUS | Status: AC
  Administered 2019-01-13: 1000 mg via INTRAVENOUS
  Filled 2019-01-13: qty 8

## 2019-01-13 MED ORDER — SODIUM CHLORIDE 0.9 % IV SOLN
INTRAVENOUS | Status: DC | PRN
  Administered 2019-01-13: 250 mL via INTRAVENOUS

## 2019-01-13 NOTE — Discharge Instructions (Signed)
Methylprednisolone Solution for Injection What is this medicine? METHYLPREDNISOLONE (meth ill pred NISS oh lone) is a corticosteroid. It is commonly used to treat inflammation of the skin, joints, lungs, and other organs. Common conditions treated include asthma, allergies, and arthritis. It is also used for other conditions, such as blood disorders and diseases of the adrenal glands. This medicine may be used for other purposes; ask your health care provider or pharmacist if you have questions. COMMON BRAND NAME(S): A-Methapred, Solu-Medrol What should I tell my health care provider before I take this medicine? They need to know if you have any of these conditions:  Cushing's syndrome  eye disease, vision problems  diabetes  glaucoma  heart disease  high blood pressure  infection (especially a virus infection such as chickenpox, cold sores, or herpes)  liver disease  mental illness  myasthenia gravis  osteoporosis  recently received or scheduled to receive a vaccine  seizures  stomach or intestine problems  thyroid disease  an unusual or allergic reaction to lactose, methylprednisolone, other medicines, foods, dyes, or preservatives  pregnant or trying to get pregnant  breast-feeding How should I use this medicine? This medicine is for injection or infusion into a vein. It is also for injection into a muscle. It is given by a health care professional in a hospital or clinic setting. Talk to your pediatrician regarding the use of this medicine in children. While this drug may be prescribed for selected conditions, precautions do apply. Overdosage: If you think you have taken too much of this medicine contact a poison control center or emergency room at once. NOTE: This medicine is only for you. Do not share this medicine with others. What if I miss a dose? This does not apply. What may interact with this medicine? Do not take this medicine with any of the following  medications:  alefacept  echinacea  iopamidol  live virus vaccines  metyrapone  mifepristone This medicine may also interact with the following medications:  amphotericin B  aspirin and aspirin-like medicines  certain antibiotics like erythromycin, clarithromycin, troleandomycin  certain medicines for diabetes  certain medicines for fungal infection like ketoconazole  certain medicines for seizures like carbamazepine, phenobarbital, phenytoin  certain medicines that treat or prevent blood clots like warfarin  cyclosporine  digoxin  diuretics  female hormones, like estrogens and birth control pills  isoniazid  NSAIDS, medicines for pain and inflammation, like ibuprofen or naproxen  other medicines for myasthenia gravis  rifampin  vaccines This list may not describe all possible interactions. Give your health care provider a list of all the medicines, herbs, non-prescription drugs, or dietary supplements you use. Also tell them if you smoke, drink alcohol, or use illegal drugs. Some items may interact with your medicine. What should I watch for while using this medicine? Tell your doctor or healthcare professional if your symptoms do not start to get better or if they get worse. Do not stop taking except on your doctor's advice. You may develop a severe reaction. Your doctor will tell you how much medicine to take. Your condition will be monitored carefully while you are receiving this medicine. This medicine may increase your risk of getting an infection. Tell your doctor or health care professional if you are around anyone with measles or chickenpox, or if you develop sores or blisters that do not heal properly. This medicine may increase blood sugar. Ask your healthcare provider if changes in diet or medicines are needed if you have diabetes. Tell   your doctor or health care professional right away if you have any change in your eyesight. Using this medicine for a  long time may increase your risk of low bone mass. Talk to your doctor about bone health. What side effects may I notice from receiving this medicine? Side effects that you should report to your doctor or health care professional as soon as possible:  allergic reactions like skin rash, itching or hives, swelling of the face, lips, or tongue  bloody or tarry stools  hallucination, loss of contact with reality  muscle cramps  muscle pain  palpitations  signs and symptoms of high blood sugar such as being more thirsty or hungry or having to urinate more than normal. You may also feel very tired or have blurry vision.  signs and symptoms of infection like fever or chills; cough; sore throat; pain or trouble passing urine  trouble passing urine Side effects that usually do not require medical attention (report to your doctor or health care professional if they continue or are bothersome):  changes in emotions or mood  constipation  diarrhea  excessive hair growth on the face or body  headache  nausea, vomiting  pain, redness, or irritation at site where injected  trouble sleeping  weight gain This list may not describe all possible side effects. Call your doctor for medical advice about side effects. You may report side effects to FDA at 1-800-FDA-1088. Where should I keep my medicine? This drug is given in a hospital or clinic and will not be stored at home. NOTE: This sheet is a summary. It may not cover all possible information. If you have questions about this medicine, talk to your doctor, pharmacist, or health care provider.  2020 Elsevier/Gold Standard (2018-02-24 09:12:19)  

## 2019-01-13 NOTE — Progress Notes (Signed)
Patient received Solu-medrol via PIV.Tolerated well, vitals stable, discharge instructions given, verbalized understanding. Patient alert, oriented and ambulatory at the time of discharge.  

## 2019-01-24 ENCOUNTER — Encounter: Payer: Self-pay | Admitting: Neurology

## 2019-01-24 NOTE — Progress Notes (Signed)
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  There is clear interval improvement in NCS amplitudes and velocities and EMG is also greatly improved with resolution of spontaneous activity compared with an outside study in 2011.  The marked change from earlier study strongly implies an acquired rather than hereditary neuropathy.

## 2019-01-25 NOTE — Progress Notes (Signed)
Pt is scheduled for virtual visit and is scheduled.

## 2019-02-06 ENCOUNTER — Ambulatory Visit: Payer: Self-pay | Admitting: Neurology

## 2019-02-07 ENCOUNTER — Encounter: Payer: Self-pay | Admitting: Neurology

## 2019-02-08 ENCOUNTER — Telehealth (INDEPENDENT_AMBULATORY_CARE_PROVIDER_SITE_OTHER): Payer: 59 | Admitting: Neurology

## 2019-02-08 ENCOUNTER — Other Ambulatory Visit: Payer: Self-pay

## 2019-02-08 VITALS — Ht 66.0 in | Wt 163.0 lb

## 2019-02-08 DIAGNOSIS — G6181 Chronic inflammatory demyelinating polyneuritis: Secondary | ICD-10-CM

## 2019-02-08 NOTE — Progress Notes (Signed)
Working on this at present, trying to get patient care

## 2019-02-08 NOTE — Progress Notes (Signed)
Contacted patient care and patient is scheduled for sept 4 at 11:00 patient notified of this .

## 2019-02-08 NOTE — Progress Notes (Signed)
   Virtual Visit via Video Note The purpose of this virtual visit is to provide medical care while limiting exposure to the novel coronavirus.    Consent was obtained for video visit:  Yes.   Answered questions that patient had about telehealth interaction:  Yes.   I discussed the limitations, risks, security and privacy concerns of performing an evaluation and management service by telemedicine. I also discussed with the patient that there may be a patient responsible charge related to this service. The patient expressed understanding and agreed to proceed.  Pt location: Home Physician Location: office Name of referring provider:  Hayden Rasmussen, MD I connected with Hannah Bowen at patients initiation/request on 02/08/2019 at  9:10 AM EDT by video enabled telemedicine application and verified that I am speaking with the correct person using two identifiers. Pt MRN:  [id number] Pt DOB:  May 29, 1961 Video Participants:  Hannah Bowen   History of Present Illness: This is a 58 y.o. female returning for follow-up of CIDP.  She received 5-days of solumedrol, which significantly reduced generalized tingling and increased energy.  Over the past two days, she feels that her tingling is slightly worse.  She denies any imbalance or weakness.  She successfully tapered off prednisone.   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  There is clear interval improvement in NCS amplitudes and velocities and EMG is also greatly improved with resolution of spontaneous activity compared to outside study in 2011.  Marked change from the earlier study strongly implies an acquired rather than hereditary neuropathy.   Observations/Objective:   Vitals:   02/07/19 0836  Weight: 163 lb (73.9 kg)  Height: 5\' 6"  (1.676 m)   Patient is awake, alert, and appears  comfortable.  Oriented x 4.   Extraocular muscles are intact. No ptosis.  Face is symmetric.  Speech is not dysarthric. Tongue is midline. Antigravity in all extremities.  No pronator drift. Gait appears normal.  Heel and toe walking intact.   Assessment and Plan:  Chronic inflammatory demyelinating polyradiculopathy diagnosed in 2011 with exacerbation of sensory symptoms during the summer of 2020.  She does not have motor deficits or ataxia. She was treated with Solumedrol 1g x 5 days in early August and responded, but continues to have some paresthesias.  I will plan for additional Solumedrol 1g every 28 days for 3 months and reassess.  Risks and benefits of corticosteroids were discussed.    Follow Up Instructions:   I discussed the assessment and treatment plan with the patient. The patient was provided an opportunity to ask questions and all were answered. The patient agreed with the plan and demonstrated an understanding of the instructions.   The patient was advised to call back or seek an in-person evaluation if the symptoms worsen or if the condition fails to improve as anticipated.  Follow-up in 3 months  Total time spent:  25 minutes     Alda Berthold, DO

## 2019-02-10 ENCOUNTER — Other Ambulatory Visit: Payer: Self-pay

## 2019-02-10 ENCOUNTER — Ambulatory Visit (HOSPITAL_COMMUNITY)
Admission: RE | Admit: 2019-02-10 | Discharge: 2019-02-10 | Disposition: A | Discharge status: Home / Self Care | Payer: 59 | Source: Ambulatory Visit | Attending: Internal Medicine | Admitting: Internal Medicine

## 2019-02-10 DIAGNOSIS — G6181 Chronic inflammatory demyelinating polyneuritis: Secondary | ICD-10-CM | POA: Insufficient documentation

## 2019-02-10 MED ORDER — SODIUM CHLORIDE 0.9 % IV SOLN
1000.0000 mg | Freq: Once | INTRAVENOUS | Status: AC
  Administered 2019-02-10: 1000 mg via INTRAVENOUS
  Filled 2019-02-10: qty 8

## 2019-02-10 NOTE — Progress Notes (Signed)
Patient received 1GM of IV solumedrol as ordered by Narda Amber, MD.  Tolerated well.  Vital signs stable.  Ambulatory at time of discharge.

## 2019-02-10 NOTE — Discharge Instructions (Signed)
Methylprednisolone Solution for Injection What is this medicine? METHYLPREDNISOLONE (meth ill pred NISS oh lone) is a corticosteroid. It is commonly used to treat inflammation of the skin, joints, lungs, and other organs. Common conditions treated include asthma, allergies, and arthritis. It is also used for other conditions, such as blood disorders and diseases of the adrenal glands. This medicine may be used for other purposes; ask your health care provider or pharmacist if you have questions. COMMON BRAND NAME(S): A-Methapred, Solu-Medrol What should I tell my health care provider before I take this medicine? They need to know if you have any of these conditions:  Cushing's syndrome  eye disease, vision problems  diabetes  glaucoma  heart disease  high blood pressure  infection (especially a virus infection such as chickenpox, cold sores, or herpes)  liver disease  mental illness  myasthenia gravis  osteoporosis  recently received or scheduled to receive a vaccine  seizures  stomach or intestine problems  thyroid disease  an unusual or allergic reaction to lactose, methylprednisolone, other medicines, foods, dyes, or preservatives  pregnant or trying to get pregnant  breast-feeding How should I use this medicine? This medicine is for injection or infusion into a vein. It is also for injection into a muscle. It is given by a health care professional in a hospital or clinic setting. Talk to your pediatrician regarding the use of this medicine in children. While this drug may be prescribed for selected conditions, precautions do apply. Overdosage: If you think you have taken too much of this medicine contact a poison control center or emergency room at once. NOTE: This medicine is only for you. Do not share this medicine with others. What if I miss a dose? This does not apply. What may interact with this medicine? Do not take this medicine with any of the following  medications:  alefacept  echinacea  iopamidol  live virus vaccines  metyrapone  mifepristone This medicine may also interact with the following medications:  amphotericin B  aspirin and aspirin-like medicines  certain antibiotics like erythromycin, clarithromycin, troleandomycin  certain medicines for diabetes  certain medicines for fungal infection like ketoconazole  certain medicines for seizures like carbamazepine, phenobarbital, phenytoin  certain medicines that treat or prevent blood clots like warfarin  cyclosporine  digoxin  diuretics  female hormones, like estrogens and birth control pills  isoniazid  NSAIDS, medicines for pain and inflammation, like ibuprofen or naproxen  other medicines for myasthenia gravis  rifampin  vaccines This list may not describe all possible interactions. Give your health care provider a list of all the medicines, herbs, non-prescription drugs, or dietary supplements you use. Also tell them if you smoke, drink alcohol, or use illegal drugs. Some items may interact with your medicine. What should I watch for while using this medicine? Tell your doctor or healthcare professional if your symptoms do not start to get better or if they get worse. Do not stop taking except on your doctor's advice. You may develop a severe reaction. Your doctor will tell you how much medicine to take. Your condition will be monitored carefully while you are receiving this medicine. This medicine may increase your risk of getting an infection. Tell your doctor or health care professional if you are around anyone with measles or chickenpox, or if you develop sores or blisters that do not heal properly. This medicine may increase blood sugar. Ask your healthcare provider if changes in diet or medicines are needed if you have diabetes. Tell   your doctor or health care professional right away if you have any change in your eyesight. Using this medicine for a  long time may increase your risk of low bone mass. Talk to your doctor about bone health. What side effects may I notice from receiving this medicine? Side effects that you should report to your doctor or health care professional as soon as possible:  allergic reactions like skin rash, itching or hives, swelling of the face, lips, or tongue  bloody or tarry stools  hallucination, loss of contact with reality  muscle cramps  muscle pain  palpitations  signs and symptoms of high blood sugar such as being more thirsty or hungry or having to urinate more than normal. You may also feel very tired or have blurry vision.  signs and symptoms of infection like fever or chills; cough; sore throat; pain or trouble passing urine  trouble passing urine Side effects that usually do not require medical attention (report to your doctor or health care professional if they continue or are bothersome):  changes in emotions or mood  constipation  diarrhea  excessive hair growth on the face or body  headache  nausea, vomiting  pain, redness, or irritation at site where injected  trouble sleeping  weight gain This list may not describe all possible side effects. Call your doctor for medical advice about side effects. You may report side effects to FDA at 1-800-FDA-1088. Where should I keep my medicine? This drug is given in a hospital or clinic and will not be stored at home. NOTE: This sheet is a summary. It may not cover all possible information. If you have questions about this medicine, talk to your doctor, pharmacist, or health care provider.  2020 Elsevier/Gold Standard (2018-02-24 09:12:19)  

## 2019-03-10 ENCOUNTER — Other Ambulatory Visit: Payer: Self-pay

## 2019-03-10 ENCOUNTER — Ambulatory Visit (HOSPITAL_COMMUNITY)
Admission: RE | Admit: 2019-03-10 | Discharge: 2019-03-10 | Disposition: A | Discharge status: Home / Self Care | Payer: 59 | Source: Ambulatory Visit | Attending: Internal Medicine | Admitting: Internal Medicine

## 2019-03-10 DIAGNOSIS — G6181 Chronic inflammatory demyelinating polyneuritis: Secondary | ICD-10-CM | POA: Diagnosis not present

## 2019-03-10 MED ORDER — SODIUM CHLORIDE 0.9 % IV SOLN
1000.0000 mg | INTRAVENOUS | Status: DC
  Administered 2019-03-10: 1000 mg via INTRAVENOUS
  Filled 2019-03-10: qty 1000

## 2019-03-10 MED ORDER — SODIUM CHLORIDE 0.9 % IV SOLN
1000.0000 mg | INTRAVENOUS | Status: DC
  Filled 2019-03-10: qty 1000

## 2019-03-10 MED ORDER — SODIUM CHLORIDE 0.9 % IV SOLN
INTRAVENOUS | Status: DC | PRN
  Administered 2019-03-10: 250 mL via INTRAVENOUS

## 2019-03-10 NOTE — Progress Notes (Signed)
Patient received 1 gm of IV Solumedrol via PIV as ordered by Narda Amber MD. Tolerated well, vitals stable, discharge instructions given, verbalized understanding. Patient alert, oriented and ambulatory at the time of discharge.

## 2019-03-10 NOTE — Discharge Instructions (Signed)
Methylprednisolone Solution for Injection What is this medicine? METHYLPREDNISOLONE (meth ill pred NISS oh lone) is a corticosteroid. It is commonly used to treat inflammation of the skin, joints, lungs, and other organs. Common conditions treated include asthma, allergies, and arthritis. It is also used for other conditions, such as blood disorders and diseases of the adrenal glands. This medicine may be used for other purposes; ask your health care provider or pharmacist if you have questions. COMMON BRAND NAME(S): A-Methapred, Solu-Medrol What should I tell my health care provider before I take this medicine? They need to know if you have any of these conditions:  Cushing's syndrome  eye disease, vision problems  diabetes  glaucoma  heart disease  high blood pressure  infection (especially a virus infection such as chickenpox, cold sores, or herpes)  liver disease  mental illness  myasthenia gravis  osteoporosis  recently received or scheduled to receive a vaccine  seizures  stomach or intestine problems  thyroid disease  an unusual or allergic reaction to lactose, methylprednisolone, other medicines, foods, dyes, or preservatives  pregnant or trying to get pregnant  breast-feeding How should I use this medicine? This medicine is for injection or infusion into a vein. It is also for injection into a muscle. It is given by a health care professional in a hospital or clinic setting. Talk to your pediatrician regarding the use of this medicine in children. While this drug may be prescribed for selected conditions, precautions do apply. Overdosage: If you think you have taken too much of this medicine contact a poison control center or emergency room at once. NOTE: This medicine is only for you. Do not share this medicine with others. What if I miss a dose? This does not apply. What may interact with this medicine? Do not take this medicine with any of the following  medications:  alefacept  echinacea  iopamidol  live virus vaccines  metyrapone  mifepristone This medicine may also interact with the following medications:  amphotericin B  aspirin and aspirin-like medicines  certain antibiotics like erythromycin, clarithromycin, troleandomycin  certain medicines for diabetes  certain medicines for fungal infection like ketoconazole  certain medicines for seizures like carbamazepine, phenobarbital, phenytoin  certain medicines that treat or prevent blood clots like warfarin  cyclosporine  digoxin  diuretics  female hormones, like estrogens and birth control pills  isoniazid  NSAIDS, medicines for pain and inflammation, like ibuprofen or naproxen  other medicines for myasthenia gravis  rifampin  vaccines This list may not describe all possible interactions. Give your health care provider a list of all the medicines, herbs, non-prescription drugs, or dietary supplements you use. Also tell them if you smoke, drink alcohol, or use illegal drugs. Some items may interact with your medicine. What should I watch for while using this medicine? Tell your doctor or healthcare professional if your symptoms do not start to get better or if they get worse. Do not stop taking except on your doctor's advice. You may develop a severe reaction. Your doctor will tell you how much medicine to take. Your condition will be monitored carefully while you are receiving this medicine. This medicine may increase your risk of getting an infection. Tell your doctor or health care professional if you are around anyone with measles or chickenpox, or if you develop sores or blisters that do not heal properly. This medicine may increase blood sugar. Ask your healthcare provider if changes in diet or medicines are needed if you have diabetes. Tell   your doctor or health care professional right away if you have any change in your eyesight. Using this medicine for a  long time may increase your risk of low bone mass. Talk to your doctor about bone health. What side effects may I notice from receiving this medicine? Side effects that you should report to your doctor or health care professional as soon as possible:  allergic reactions like skin rash, itching or hives, swelling of the face, lips, or tongue  bloody or tarry stools  hallucination, loss of contact with reality  muscle cramps  muscle pain  palpitations  signs and symptoms of high blood sugar such as being more thirsty or hungry or having to urinate more than normal. You may also feel very tired or have blurry vision.  signs and symptoms of infection like fever or chills; cough; sore throat; pain or trouble passing urine  trouble passing urine Side effects that usually do not require medical attention (report to your doctor or health care professional if they continue or are bothersome):  changes in emotions or mood  constipation  diarrhea  excessive hair growth on the face or body  headache  nausea, vomiting  pain, redness, or irritation at site where injected  trouble sleeping  weight gain This list may not describe all possible side effects. Call your doctor for medical advice about side effects. You may report side effects to FDA at 1-800-FDA-1088. Where should I keep my medicine? This drug is given in a hospital or clinic and will not be stored at home. NOTE: This sheet is a summary. It may not cover all possible information. If you have questions about this medicine, talk to your doctor, pharmacist, or health care provider.  2020 Elsevier/Gold Standard (2018-02-24 09:12:19)  

## 2019-04-07 ENCOUNTER — Ambulatory Visit (HOSPITAL_COMMUNITY)
Admission: RE | Admit: 2019-04-07 | Discharge: 2019-04-07 | Disposition: A | Discharge status: Home / Self Care | Payer: 59 | Source: Ambulatory Visit | Attending: Internal Medicine | Admitting: Internal Medicine

## 2019-04-07 ENCOUNTER — Other Ambulatory Visit: Payer: Self-pay

## 2019-04-07 DIAGNOSIS — G6181 Chronic inflammatory demyelinating polyneuritis: Secondary | ICD-10-CM | POA: Diagnosis not present

## 2019-04-07 MED ORDER — SODIUM CHLORIDE 0.9 % IV SOLN
1000.0000 mg | Freq: Once | INTRAVENOUS | Status: AC
  Administered 2019-04-07: 11:00:00 1000 mg via INTRAVENOUS
  Filled 2019-04-07: qty 1000

## 2019-04-07 MED ORDER — SODIUM CHLORIDE 0.9 % IV SOLN
INTRAVENOUS | Status: DC | PRN
  Administered 2019-04-07: 11:00:00 250 mL via INTRAVENOUS

## 2019-04-07 NOTE — Discharge Instructions (Signed)
Methylprednisolone Solution for Injection What is this medicine? METHYLPREDNISOLONE (meth ill pred NISS oh lone) is a corticosteroid. It is commonly used to treat inflammation of the skin, joints, lungs, and other organs. Common conditions treated include asthma, allergies, and arthritis. It is also used for other conditions, such as blood disorders and diseases of the adrenal glands. This medicine may be used for other purposes; ask your health care provider or pharmacist if you have questions. COMMON BRAND NAME(S): A-Methapred, Solu-Medrol What should I tell my health care provider before I take this medicine? They need to know if you have any of these conditions:  Cushing's syndrome  eye disease, vision problems  diabetes  glaucoma  heart disease  high blood pressure  infection (especially a virus infection such as chickenpox, cold sores, or herpes)  liver disease  mental illness  myasthenia gravis  osteoporosis  recently received or scheduled to receive a vaccine  seizures  stomach or intestine problems  thyroid disease  an unusual or allergic reaction to lactose, methylprednisolone, other medicines, foods, dyes, or preservatives  pregnant or trying to get pregnant  breast-feeding How should I use this medicine? This medicine is for injection or infusion into a vein. It is also for injection into a muscle. It is given by a health care professional in a hospital or clinic setting. Talk to your pediatrician regarding the use of this medicine in children. While this drug may be prescribed for selected conditions, precautions do apply. Overdosage: If you think you have taken too much of this medicine contact a poison control center or emergency room at once. NOTE: This medicine is only for you. Do not share this medicine with others. What if I miss a dose? This does not apply. What may interact with this medicine? Do not take this medicine with any of the following  medications:  alefacept  echinacea  iopamidol  live virus vaccines  metyrapone  mifepristone This medicine may also interact with the following medications:  amphotericin B  aspirin and aspirin-like medicines  certain antibiotics like erythromycin, clarithromycin, troleandomycin  certain medicines for diabetes  certain medicines for fungal infection like ketoconazole  certain medicines for seizures like carbamazepine, phenobarbital, phenytoin  certain medicines that treat or prevent blood clots like warfarin  cyclosporine  digoxin  diuretics  female hormones, like estrogens and birth control pills  isoniazid  NSAIDS, medicines for pain and inflammation, like ibuprofen or naproxen  other medicines for myasthenia gravis  rifampin  vaccines This list may not describe all possible interactions. Give your health care provider a list of all the medicines, herbs, non-prescription drugs, or dietary supplements you use. Also tell them if you smoke, drink alcohol, or use illegal drugs. Some items may interact with your medicine. What should I watch for while using this medicine? Tell your doctor or healthcare professional if your symptoms do not start to get better or if they get worse. Do not stop taking except on your doctor's advice. You may develop a severe reaction. Your doctor will tell you how much medicine to take. Your condition will be monitored carefully while you are receiving this medicine. This medicine may increase your risk of getting an infection. Tell your doctor or health care professional if you are around anyone with measles or chickenpox, or if you develop sores or blisters that do not heal properly. This medicine may increase blood sugar. Ask your healthcare provider if changes in diet or medicines are needed if you have diabetes. Tell   your doctor or health care professional right away if you have any change in your eyesight. Using this medicine for a  long time may increase your risk of low bone mass. Talk to your doctor about bone health. What side effects may I notice from receiving this medicine? Side effects that you should report to your doctor or health care professional as soon as possible:  allergic reactions like skin rash, itching or hives, swelling of the face, lips, or tongue  bloody or tarry stools  hallucination, loss of contact with reality  muscle cramps  muscle pain  palpitations  signs and symptoms of high blood sugar such as being more thirsty or hungry or having to urinate more than normal. You may also feel very tired or have blurry vision.  signs and symptoms of infection like fever or chills; cough; sore throat; pain or trouble passing urine  trouble passing urine Side effects that usually do not require medical attention (report to your doctor or health care professional if they continue or are bothersome):  changes in emotions or mood  constipation  diarrhea  excessive hair growth on the face or body  headache  nausea, vomiting  pain, redness, or irritation at site where injected  trouble sleeping  weight gain This list may not describe all possible side effects. Call your doctor for medical advice about side effects. You may report side effects to FDA at 1-800-FDA-1088. Where should I keep my medicine? This drug is given in a hospital or clinic and will not be stored at home. NOTE: This sheet is a summary. It may not cover all possible information. If you have questions about this medicine, talk to your doctor, pharmacist, or health care provider.  2020 Elsevier/Gold Standard (2018-02-24 09:12:19)  

## 2019-04-07 NOTE — Progress Notes (Signed)
Patient received 1 gm of Solu-medrol via PIV as ordered by Narda Amber MD. Tolerated well, vitals stable, discharge instructions given, verbalized understanding. Patient alert, oriented and ambulatory at the time of discharge.

## 2019-05-08 ENCOUNTER — Ambulatory Visit (HOSPITAL_COMMUNITY): Payer: 59

## 2019-05-12 ENCOUNTER — Ambulatory Visit (HOSPITAL_COMMUNITY)
Admission: RE | Admit: 2019-05-12 | Discharge: 2019-05-12 | Disposition: A | Discharge status: Home / Self Care | Payer: 59 | Source: Ambulatory Visit | Attending: Internal Medicine | Admitting: Internal Medicine

## 2019-05-12 DIAGNOSIS — G6181 Chronic inflammatory demyelinating polyneuritis: Secondary | ICD-10-CM | POA: Insufficient documentation

## 2019-05-12 MED ORDER — SODIUM CHLORIDE 0.9 % IV SOLN
INTRAVENOUS | Status: DC | PRN
  Administered 2019-05-12: 250 mL via INTRAVENOUS

## 2019-05-12 MED ORDER — SODIUM CHLORIDE 0.9 % IV SOLN
1000.0000 mg | INTRAVENOUS | Status: DC
  Administered 2019-05-12: 10:00:00 1000 mg via INTRAVENOUS
  Filled 2019-05-12: qty 8

## 2019-05-12 NOTE — Discharge Instructions (Signed)
Methylprednisolone Solution for Injection What is this medicine? METHYLPREDNISOLONE (meth ill pred NISS oh lone) is a corticosteroid. It is commonly used to treat inflammation of the skin, joints, lungs, and other organs. Common conditions treated include asthma, allergies, and arthritis. It is also used for other conditions, such as blood disorders and diseases of the adrenal glands. This medicine may be used for other purposes; ask your health care provider or pharmacist if you have questions. COMMON BRAND NAME(S): A-Methapred, Solu-Medrol What should I tell my health care provider before I take this medicine? They need to know if you have any of these conditions:  Cushing's syndrome  eye disease, vision problems  diabetes  glaucoma  heart disease  high blood pressure  infection (especially a virus infection such as chickenpox, cold sores, or herpes)  liver disease  mental illness  myasthenia gravis  osteoporosis  recently received or scheduled to receive a vaccine  seizures  stomach or intestine problems  thyroid disease  an unusual or allergic reaction to lactose, methylprednisolone, other medicines, foods, dyes, or preservatives  pregnant or trying to get pregnant  breast-feeding How should I use this medicine? This medicine is for injection or infusion into a vein. It is also for injection into a muscle. It is given by a health care professional in a hospital or clinic setting. Talk to your pediatrician regarding the use of this medicine in children. While this drug may be prescribed for selected conditions, precautions do apply. Overdosage: If you think you have taken too much of this medicine contact a poison control center or emergency room at once. NOTE: This medicine is only for you. Do not share this medicine with others. What if I miss a dose? This does not apply. What may interact with this medicine? Do not take this medicine with any of the following  medications:  alefacept  echinacea  iopamidol  live virus vaccines  metyrapone  mifepristone This medicine may also interact with the following medications:  amphotericin B  aspirin and aspirin-like medicines  certain antibiotics like erythromycin, clarithromycin, troleandomycin  certain medicines for diabetes  certain medicines for fungal infection like ketoconazole  certain medicines for seizures like carbamazepine, phenobarbital, phenytoin  certain medicines that treat or prevent blood clots like warfarin  cyclosporine  digoxin  diuretics  female hormones, like estrogens and birth control pills  isoniazid  NSAIDS, medicines for pain and inflammation, like ibuprofen or naproxen  other medicines for myasthenia gravis  rifampin  vaccines This list may not describe all possible interactions. Give your health care provider a list of all the medicines, herbs, non-prescription drugs, or dietary supplements you use. Also tell them if you smoke, drink alcohol, or use illegal drugs. Some items may interact with your medicine. What should I watch for while using this medicine? Tell your doctor or healthcare professional if your symptoms do not start to get better or if they get worse. Do not stop taking except on your doctor's advice. You may develop a severe reaction. Your doctor will tell you how much medicine to take. Your condition will be monitored carefully while you are receiving this medicine. This medicine may increase your risk of getting an infection. Tell your doctor or health care professional if you are around anyone with measles or chickenpox, or if you develop sores or blisters that do not heal properly. This medicine may increase blood sugar. Ask your healthcare provider if changes in diet or medicines are needed if you have diabetes. Tell   your doctor or health care professional right away if you have any change in your eyesight. Using this medicine for a  long time may increase your risk of low bone mass. Talk to your doctor about bone health. What side effects may I notice from receiving this medicine? Side effects that you should report to your doctor or health care professional as soon as possible:  allergic reactions like skin rash, itching or hives, swelling of the face, lips, or tongue  bloody or tarry stools  hallucination, loss of contact with reality  muscle cramps  muscle pain  palpitations  signs and symptoms of high blood sugar such as being more thirsty or hungry or having to urinate more than normal. You may also feel very tired or have blurry vision.  signs and symptoms of infection like fever or chills; cough; sore throat; pain or trouble passing urine  trouble passing urine Side effects that usually do not require medical attention (report to your doctor or health care professional if they continue or are bothersome):  changes in emotions or mood  constipation  diarrhea  excessive hair growth on the face or body  headache  nausea, vomiting  pain, redness, or irritation at site where injected  trouble sleeping  weight gain This list may not describe all possible side effects. Call your doctor for medical advice about side effects. You may report side effects to FDA at 1-800-FDA-1088. Where should I keep my medicine? This drug is given in a hospital or clinic and will not be stored at home. NOTE: This sheet is a summary. It may not cover all possible information. If you have questions about this medicine, talk to your doctor, pharmacist, or health care provider.  2020 Elsevier/Gold Standard (2018-02-24 09:12:19)  

## 2019-05-12 NOTE — Progress Notes (Signed)
PATIENT CARE CENTER NOTE  Diagnosis: CIDP (chronic inflammatory demyelinating polyneuropathy) (HCC) (G61.81)   Provider: Narda Amber, MD   Procedure: IV Solu-medrol   Note: Patient received 1000 mg Solu-medrol via PIV. Tolerated well with no adverse reaction. Vital signs stable. Discharge instructions given. Patient to come back next month for last infusion. Alert, oriented and ambulatory at discharge.

## 2019-06-12 ENCOUNTER — Other Ambulatory Visit: Payer: Self-pay

## 2019-06-12 ENCOUNTER — Ambulatory Visit (HOSPITAL_COMMUNITY)
Admission: RE | Admit: 2019-06-12 | Discharge: 2019-06-12 | Disposition: A | Discharge status: Home / Self Care | Payer: 59 | Source: Ambulatory Visit | Attending: Internal Medicine | Admitting: Internal Medicine

## 2019-06-12 DIAGNOSIS — G6181 Chronic inflammatory demyelinating polyneuritis: Secondary | ICD-10-CM | POA: Diagnosis not present

## 2019-06-12 MED ORDER — SODIUM CHLORIDE 0.9 % IV SOLN
INTRAVENOUS | Status: DC | PRN
  Administered 2019-06-12: 250 mL via INTRAVENOUS

## 2019-06-12 MED ORDER — SODIUM CHLORIDE 0.9 % IV SOLN
1000.0000 mg | Freq: Once | INTRAVENOUS | Status: AC
  Administered 2019-06-12: 1000 mg via INTRAVENOUS
  Filled 2019-06-12: qty 8

## 2019-06-12 NOTE — Progress Notes (Signed)
Patient received IV Solu-medrol as ordered by Patel, Donika MD.Tolerated well, vitals stable, discharge instructions given, verbalized understanding. Patient alert, oriented and ambulatory at the time of discharge °

## 2019-06-12 NOTE — Discharge Instructions (Signed)
Methylprednisolone Solution for Injection What is this medicine? METHYLPREDNISOLONE (meth ill pred NISS oh lone) is a corticosteroid. It is commonly used to treat inflammation of the skin, joints, lungs, and other organs. Common conditions treated include asthma, allergies, and arthritis. It is also used for other conditions, such as blood disorders and diseases of the adrenal glands. This medicine may be used for other purposes; ask your health care provider or pharmacist if you have questions. COMMON BRAND NAME(S): A-Methapred, Solu-Medrol What should I tell my health care provider before I take this medicine? They need to know if you have any of these conditions:  Cushing's syndrome  eye disease, vision problems  diabetes  glaucoma  heart disease  high blood pressure  infection (especially a virus infection such as chickenpox, cold sores, or herpes)  liver disease  mental illness  myasthenia gravis  osteoporosis  recently received or scheduled to receive a vaccine  seizures  stomach or intestine problems  thyroid disease  an unusual or allergic reaction to lactose, methylprednisolone, other medicines, foods, dyes, or preservatives  pregnant or trying to get pregnant  breast-feeding How should I use this medicine? This medicine is for injection or infusion into a vein. It is also for injection into a muscle. It is given by a health care professional in a hospital or clinic setting. Talk to your pediatrician regarding the use of this medicine in children. While this drug may be prescribed for selected conditions, precautions do apply. Overdosage: If you think you have taken too much of this medicine contact a poison control center or emergency room at once. NOTE: This medicine is only for you. Do not share this medicine with others. What if I miss a dose? This does not apply. What may interact with this medicine? Do not take this medicine with any of the following  medications:  alefacept  echinacea  iopamidol  live virus vaccines  metyrapone  mifepristone This medicine may also interact with the following medications:  amphotericin B  aspirin and aspirin-like medicines  certain antibiotics like erythromycin, clarithromycin, troleandomycin  certain medicines for diabetes  certain medicines for fungal infection like ketoconazole  certain medicines for seizures like carbamazepine, phenobarbital, phenytoin  certain medicines that treat or prevent blood clots like warfarin  cyclosporine  digoxin  diuretics  female hormones, like estrogens and birth control pills  isoniazid  NSAIDS, medicines for pain and inflammation, like ibuprofen or naproxen  other medicines for myasthenia gravis  rifampin  vaccines This list may not describe all possible interactions. Give your health care provider a list of all the medicines, herbs, non-prescription drugs, or dietary supplements you use. Also tell them if you smoke, drink alcohol, or use illegal drugs. Some items may interact with your medicine. What should I watch for while using this medicine? Tell your doctor or healthcare professional if your symptoms do not start to get better or if they get worse. Do not stop taking except on your doctor's advice. You may develop a severe reaction. Your doctor will tell you how much medicine to take. Your condition will be monitored carefully while you are receiving this medicine. This medicine may increase your risk of getting an infection. Tell your doctor or health care professional if you are around anyone with measles or chickenpox, or if you develop sores or blisters that do not heal properly. This medicine may increase blood sugar. Ask your healthcare provider if changes in diet or medicines are needed if you have diabetes. Tell   your doctor or health care professional right away if you have any change in your eyesight. Using this medicine for a  long time may increase your risk of low bone mass. Talk to your doctor about bone health. What side effects may I notice from receiving this medicine? Side effects that you should report to your doctor or health care professional as soon as possible:  allergic reactions like skin rash, itching or hives, swelling of the face, lips, or tongue  bloody or tarry stools  hallucination, loss of contact with reality  muscle cramps  muscle pain  palpitations  signs and symptoms of high blood sugar such as being more thirsty or hungry or having to urinate more than normal. You may also feel very tired or have blurry vision.  signs and symptoms of infection like fever or chills; cough; sore throat; pain or trouble passing urine  trouble passing urine Side effects that usually do not require medical attention (report to your doctor or health care professional if they continue or are bothersome):  changes in emotions or mood  constipation  diarrhea  excessive hair growth on the face or body  headache  nausea, vomiting  pain, redness, or irritation at site where injected  trouble sleeping  weight gain This list may not describe all possible side effects. Call your doctor for medical advice about side effects. You may report side effects to FDA at 1-800-FDA-1088. Where should I keep my medicine? This drug is given in a hospital or clinic and will not be stored at home. NOTE: This sheet is a summary. It may not cover all possible information. If you have questions about this medicine, talk to your doctor, pharmacist, or health care provider.  2020 Elsevier/Gold Standard (2018-02-24 09:12:19)  

## 2019-06-21 ENCOUNTER — Encounter: Payer: Self-pay | Admitting: Neurology

## 2019-06-22 ENCOUNTER — Telehealth (INDEPENDENT_AMBULATORY_CARE_PROVIDER_SITE_OTHER): Payer: 59 | Admitting: Neurology

## 2019-06-22 ENCOUNTER — Other Ambulatory Visit: Payer: Self-pay

## 2019-06-22 DIAGNOSIS — G6181 Chronic inflammatory demyelinating polyneuritis: Secondary | ICD-10-CM | POA: Diagnosis not present

## 2019-06-22 NOTE — Progress Notes (Signed)
Virtual Visit via Video Note The purpose of this virtual visit is to provide medical care while limiting exposure to the novel coronavirus.    Consent was obtained for video visit:  Yes.   Answered questions that patient had about telehealth interaction:  Yes.   I discussed the limitations, risks, security and privacy concerns of performing an evaluation and management service by telemedicine. I also discussed with the patient that there may be a patient responsible charge related to this service. The patient expressed understanding and agreed to proceed.  Pt location: Home Physician Location: office Name of referring provider:  Hayden Rasmussen, MD I connected with Hannah Bowen at patients initiation/request on 06/22/2019 at  8:10 AM EST by video enabled telemedicine application and verified that I am speaking with the correct person using two identifiers. Pt MRN:  [id number] Pt DOB:  19-Jan-1961 Video Participants:  Hannah Bowen   History of Present Illness: This is a 59 y.o. female returning for follow-up of CIDP.  She has been getting monthly Solumedrol 1g for the past 4 months.  She has noticed improved energy, strength, and paresthesias, especially after the first few months.  With her last two cycles of treatment, she did not appreciate ongoing benefit.  She has mild tingling of the feet which tends to be her baseline.  No new weakness.  She suffered a fall yesterday when walking on her grass and has some muscle soreness and headache yesterday, which has resolved.     Observations/Objective:   There were no vitals filed for this visit. Patient is awake, alert, and appears comfortable.   Extraocular muscles are intact. No ptosis.  Face is symmetric.  Speech is not dysarthric. Antigravity in all extremities.  Finger tapping intact Gait appears normal, unassisted.  Tandem and stressed gait intact. 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  There is clear interval improvement in NCS amplitudes and velocities and EMG is also greatly improved with resolution of spontaneous activity compared to outside study in 2011.  Marked change from the earlier study strongly implies an acquired rather than hereditary neuropathy.  Assessment and Plan 1.  Chronic inflammatory demyelinating polyradiculoneuropathy diagnosed in 2011 with exacerbation of sensory symptoms during the summer of 2020.  No motor deficits or ataxia.  She had NCS/EMG performed at [facility] in August 2020 which showed mild-moderate demyelinating neuropathy, improved amplitudes as compared to outside study in 2011.  Due to clinical symptomology, she was started with Solumedrol 1g x 5 days in August, which was followed by 1g every 28 days.  She has responded very well and reports marked improvement in generalized paresthesias and fatigue.  She continues have distal paresthesias in the feet, which is baseline.  - I will monitor her off steroids - If she develops CIDP relapse, restart IVMP 1g monthly  2.  COVID19 vaccination counseling.   Although the vaccine trials did not include individuals with autoimmune disease, as we learn more about vaccine tolerability and COVID infection, I am in favor getting vaccination as neuromuscular conditions can have more severe disease, if infected with COVID19.   She had questions related to COVID and travel which were also addressed.  I recommend that she continue to monitor CDC travel advisory and try to limit international travel to essential travel only.  Follow Up Instructions:   I discussed the assessment  and treatment plan with the patient. The patient was provided an opportunity to ask questions and all were answered. The patient agreed with the plan and demonstrated an understanding of the instructions.   The  patient was advised to call back or seek an in-person evaluation if the symptoms worsen or if the condition fails to improve as anticipated.  Follow-up in September 2021  Total time spent on day of encounter:  30 minutes     Glendale Chard, DO

## 2020-01-19 ENCOUNTER — Other Ambulatory Visit: Payer: Self-pay

## 2020-01-19 ENCOUNTER — Ambulatory Visit (INDEPENDENT_AMBULATORY_CARE_PROVIDER_SITE_OTHER): Payer: 59

## 2020-01-19 ENCOUNTER — Encounter: Payer: Self-pay | Admitting: Podiatry

## 2020-01-19 ENCOUNTER — Ambulatory Visit: Payer: 59

## 2020-01-19 ENCOUNTER — Ambulatory Visit (INDEPENDENT_AMBULATORY_CARE_PROVIDER_SITE_OTHER): Payer: Self-pay | Admitting: Podiatry

## 2020-01-19 VITALS — BP 96/65 | HR 65 | Temp 97.3°F | Resp 16

## 2020-01-19 DIAGNOSIS — M2010 Hallux valgus (acquired), unspecified foot: Secondary | ICD-10-CM

## 2020-01-19 DIAGNOSIS — M2040 Other hammer toe(s) (acquired), unspecified foot: Secondary | ICD-10-CM | POA: Diagnosis not present

## 2020-01-19 DIAGNOSIS — G6181 Chronic inflammatory demyelinating polyneuritis: Secondary | ICD-10-CM

## 2020-01-19 NOTE — Patient Instructions (Addendum)
Bunion  A bunion is a bump on the base of the big toe that forms when the bones of the big toe joint move out of position. Bunions may be small at first, but they often get larger over time. They can make walking painful. What are the causes? A bunion may be caused by:  Wearing narrow or pointed shoes that force the big toe to press against the other toes.  Abnormal foot development that causes the foot to roll inward (pronate).  Changes in the foot that are caused by certain diseases, such as rheumatoid arthritis or polio.  A foot injury. What increases the risk? The following factors may make you more likely to develop this condition:  Wearing shoes that squeeze the toes together.  Having certain diseases, such as: ? Rheumatoid arthritis. ? Polio. ? Cerebral palsy.  Having family members who have bunions.  Being born with a foot deformity, such as flat feet or low arches.  Doing activities that put a lot of pressure on the feet, such as ballet dancing. What are the signs or symptoms? The main symptom of a bunion is a noticeable bump on the big toe. Other symptoms may include:  Pain.  Swelling around the big toe.  Redness and inflammation.  Thick or hardened skin on the big toe or between the toes.  Stiffness or loss of motion in the big toe.  Trouble with walking. How is this diagnosed? A bunion may be diagnosed based on your symptoms, medical history, and activities. You may have tests, such as:  X-rays. These allow your health care provider to check the position of the bones in your foot and look for damage to your joint. They also help your health care provider determine the severity of your bunion and the best way to treat it.  Joint aspiration. In this test, a sample of fluid is removed from the toe joint. This test may be done if you are in a lot of pain. It helps rule out diseases that cause painful swelling of the joints, such as arthritis. How is this  treated? Treatment depends on the severity of your symptoms. The goal of treatment is to relieve symptoms and prevent the bunion from getting worse. Your health care provider may recommend:  Wearing shoes that have a wide toe box.  Using bunion pads to cushion the affected area.  Taping your toes together to keep them in a normal position.  Placing a device inside your shoe (orthotics) to help reduce pressure on your toe joint.  Taking medicine to ease pain, inflammation, and swelling.  Applying heat or ice to the affected area.  Doing stretching exercises.  Surgery to remove scar tissue and move the toes back into their normal position. This treatment is rare. Follow these instructions at home: Managing pain, stiffness, and swelling   If directed, put ice on the painful area: ? Put ice in a plastic bag. ? Place a towel between your skin and the bag. ? Leave the ice on for 20 minutes, 2-3 times a day. Activity   If directed, apply heat to the affected area before you exercise. Use the heat source that your health care provider recommends, such as a moist heat pack or a heating pad. ? Place a towel between your skin and the heat source. ? Leave the heat on for 20-30 minutes. ? Remove the heat if your skin turns bright red. This is especially important if you are unable to feel pain,   heat, or cold. You may have a greater risk of getting burned.  Do exercises as told by your health care provider. General instructions  Support your toe joint with proper footwear, shoe padding, or taping as told by your health care provider.  Take over-the-counter and prescription medicines only as told by your health care provider.  Keep all follow-up visits as told by your health care provider. This is important. Contact a health care provider if your symptoms:  Get worse.  Do not improve in 2 weeks. Get help right away if you have:  Severe pain and trouble with walking. Summary  A  bunion is a bump on the base of the big toe that forms when the bones of the big toe joint move out of position.  Bunions can make walking painful.  Treatment depends on the severity of your symptoms.  Support your toe joint with proper footwear, shoe padding, or taping as told by your health care provider. This information is not intended to replace advice given to you by your health care provider. Make sure you discuss any questions you have with your health care provider. Document Revised: 11/29/2017 Document Reviewed: 10/05/2017 Elsevier Patient Education  Diamond Toe  Hammer toe is a change in the shape (a deformity) of your toe. The deformity causes the middle joint of your toe to stay bent. This causes pain, especially when you are wearing shoes. Hammer toe starts gradually. At first, the toe can be straightened. Gradually over time, the deformity becomes stiff and permanent. Early treatments to keep the toe straight may relieve pain. As the deformity becomes stiff and permanent, surgery may be needed to straighten the toe. What are the causes? Hammer toe is caused by abnormal bending of the toe joint that is closest to your foot. It happens gradually over time. This pulls on the muscles and connections (tendons) of the toe joint, making them weak and stiff. It is often related to wearing shoes that are too short or narrow and do not let your toes straighten. What increases the risk? You may be at greater risk for hammer toe if you:  Are female.  Are older.  Wear shoes that are too small.  Wear high-heeled shoes that pinch your toes.  Are a Engineer, mining.  Have a second toe that is longer than your big toe (first toe).  Injure your foot or toe.  Have arthritis.  Have a family history of hammer toe.  Have a nerve or muscle disorder. What are the signs or symptoms? The main symptoms of this condition are pain and deformity of the toe. The pain is  worse when wearing shoes, walking, or running. Other symptoms may include:  Corns or calluses over the bent part of the toe or between the toes.  Redness and a burning feeling on the toe.  An open sore that forms on the top of the toe.  Not being able to straighten the toe. How is this diagnosed? This condition is diagnosed based on your symptoms and a physical exam. During the exam, your health care provider will try to straighten your toe to see how stiff the deformity is. You may also have tests, such as:  A blood test to check for rheumatoid arthritis.  An X-ray to show how severe the deformity is. How is this treated? Treatment for this condition will depend on how stiff the deformity is. Surgery is often needed. However, sometimes a hammer  toe can be straightened without surgery. Treatments that do not involve surgery include:  Taping the toe into a straightened position.  Using pads and cushions to protect the toe (orthotics).  Wearing shoes that provide enough room for the toes.  Doing toe-stretching exercises at home.  Taking an NSAID to reduce pain and swelling. If these treatments do not help or the toe cannot be straightened, surgery is the next option. The most common surgeries used to straighten a hammer toe include:  Arthroplasty. In this procedure, part of the joint is removed, and that allows the toe to straighten.  Fusion. In this procedure, cartilage between the two bones of the joint is taken out and the bones are fused together into one longer bone.  Implantation. In this procedure, part of the bone is removed and replaced with an implant to let the toe move again.  Flexor tendon transfer. In this procedure, the tendons that curl the toes down (flexor tendons) are repositioned. Follow these instructions at home:  Take over-the-counter and prescription medicines only as told by your health care provider.  Do toe straightening and stretching exercises as  told by your health care provider.  Keep all follow-up visits as told by your health care provider. This is important. How is this prevented?  Wear shoes that give your toes enough room and do not cause pain.  Do not wear high-heeled shoes. Contact a health care provider if:  Your pain gets worse.  Your toe becomes red or swollen.  You develop an open sore on your toe. This information is not intended to replace advice given to you by your health care provider. Make sure you discuss any questions you have with your health care provider. Document Revised: 05/07/2017 Document Reviewed: 09/18/2015 Elsevier Patient Education  2020 [employer]   Ankle Sprain, Phase I Rehab An ankle sprain is an injury to the ligaments of your ankle. Ankle sprains cause stiffness, loss of motion, and loss of strength. Ask your health care provider which exercises are safe for you. Do exercises exactly as told by your health care provider and adjust them as directed. It is normal to feel mild stretching, pulling, tightness, or discomfort as you do these exercises. Stop right away if you feel sudden pain or your pain gets worse. Do not begin these exercises until told by your health care provider. Stretching and range-of-motion exercises These exercises warm up your muscles and joints and improve the movement and flexibility of your lower leg and ankle. These exercises also help to relieve pain and stiffness. Gastroc and soleus stretch This exercise is also called a calf stretch. It stretches the muscles in the back of the lower leg. These muscles are the gastrocnemius, or gastroc, and the soleus. 1. Sit on the floor with your left / right leg extended. 2. Loop a belt or towel around the ball of your left / right foot. The ball of your foot is on the walking surface, right under your toes. 3. Keep your left / right ankle and foot relaxed and keep your knee straight while you use the belt or towel to pull your  foot toward you. You should feel a gentle stretch behind your calf or knee in your gastroc muscle. 4. Hold this position for __________ seconds, then release to the starting position. 5. Repeat the exercise with your knee bent. You can put a pillow or a rolled bath towel under your knee to support it. You should feel a stretch  deep in your calf in the soleus muscle or at your Achilles tendon. Repeat __________ times. Complete this exercise __________ times a day. Ankle alphabet  1. Sit with your left / right leg supported at the lower leg. ? Do not rest your foot on anything. ? Make sure your foot has room to move freely. 2. Think of your left / right foot as a paintbrush. ? Move your foot to trace each letter of the alphabet in the air. Keep your hip and knee still while you trace. ? Make the letters as large as you can without feeling discomfort. 3. Trace every letter from A to Z. Repeat __________ times. Complete this exercise __________ times a day. Strengthening exercises These exercises build strength and endurance in your ankle and lower leg. Endurance is the ability to use your muscles for a long time, even after they get tired. Ankle dorsiflexion  1. Secure a rubber exercise band or tube to an object, such as a table leg, that will stay still when the band is pulled. Secure the other end around your left / right foot. 2. Sit on the floor facing the object, with your left / right leg extended. The band or tube should be slightly tense when your foot is relaxed. 3. Slowly bring your foot toward you, bringing the top of your foot toward your shin (dorsiflexion), and pulling the band tighter. 4. Hold this position for __________ seconds. 5. Slowly return your foot to the starting position. Repeat __________ times. Complete this exercise __________ times a day. Ankle plantar flexion  1. Sit on the floor with your left / right leg extended. 2. Loop a rubber exercise tube or band around  the ball of your left / right foot. The ball of your foot is on the walking surface, right under your toes. ? Hold the ends of the band or tube in your hands. ? The band or tube should be slightly tense when your foot is relaxed. 3. Slowly point your foot and toes downward to tilt the top of your foot away from your shin (plantar flexion). 4. Hold this position for __________ seconds. 5. Slowly return your foot to the starting position. Repeat __________ times. Complete this exercise __________ times a day. Ankle eversion 1. Sit on the floor with your legs straight out in front of you. 2. Loop a rubber exercise band or tube around the ball of your left / right foot. The ball of your foot is on the walking surface, right under your toes. ? Hold the ends of the band in your hands, or secure the band to a stable object. ? The band or tube should be slightly tense when your foot is relaxed. 3. Slowly push your foot outward, away from your other leg (eversion). 4. Hold this position for __________ seconds. 5. Slowly return your foot to the starting position. Repeat __________ times. Complete this exercise __________ times a day. This information is not intended to replace advice given to you by your health care provider. Make sure you discuss any questions you have with your health care provider. Document Revised: 09/13/2018 Document Reviewed: 03/07/2018 Elsevier Patient Education  2020 [employer].

## 2020-01-19 NOTE — Progress Notes (Addendum)
Subjective:   Patient ID: Hannah Bowen, female   DOB: 59 y.o.   MRN: [id number]   HPI  59 year old female with a PMH significant for CIDP presents the office today for concerns of numbness and tingling to her feet.  She is currently under the care of Dr. Allena Katz with neurology for this.  This is getting worse over the last 10 years and is intermittent.  She wants to discuss with any other treatment that we can discuss.  She recently was just started on a new medication yesterday by Dr. Allena Katz but she has not yet picked this up.  Also she has bunions and hammertoes but not causing significant pain.  She has no other concerns today.   Review of Systems  All other systems reviewed and are negative.  Past Medical History:  Diagnosis Date  . Chronic inflammatory demyelinating polyneuropathy [facility])     Past Surgical History:  Procedure Laterality Date  . CESAREAN SECTION       Current Outpatient Medications:  .  desipramine (NORPRAMIN) 25 MG tablet, Take 25 mg by mouth at bedtime. For tingling in feet, Disp: , Rfl:  .  metFORMIN (GLUCOPHAGE-XR) 500 MG 24 hr tablet, Take 500 mg by mouth daily., Disp: , Rfl:  .  ondansetron (ZOFRAN) 8 MG tablet, Take 8 mg by mouth 3 (three) times daily as needed., Disp: , Rfl:   No Known Allergies      Objective:  Physical Exam  General: AAO x3, NAD  Dermatological: Skin is warm, dry and supple bilateral. There are no open sores, no preulcerative lesions, no rash or signs of infection present.  Vascular: Dorsalis Pedis artery and Posterior Tibial artery pedal pulses are 2/4 bilateral with immedate capillary fill time.  There is no pain with calf compression, swelling, warmth, erythema.   Neruologic: Sensation decreased with Semmes Weinstein monofilament.  Musculoskeletal: Moderate bunion deformities present bilaterally and there is hammertoe contractures present but there is no tenderness palpation to this area today.  The hammertoe deformities are  flexible at this time.  Flexor, extensor tendons appear to be intact.  Mild instability of the ankles bilaterally with increase in anterior drawer and talar tilt test bilaterally there is no pain.  Muscular strength 5/5 in all groups tested bilateral.  Gait: Unassisted, Nonantalgic.     Assessment:   59 year old female with CIDP, hammertoe, bunion deformities with likely ankle instability    Plan:  -Treatment options discussed including all alternatives, risks, and complications -Etiology of symptoms were discussed -X-rays obtained reviewed.  Bunion, hammertoe contractures present but no evidence of acute fracture.  Awaiting radiology report. -From a numbness and tingling standpoint I think this is related to CIDP encouraged to follow-up with Dr. Allena Katz.  Regard to the hammertoe, bunion deformities we discussed shoe modifications with good arch support as well as different exercises she can do to help strengthen the toes.  Also discussed she has some ankle instability but does not seem to be causing any issues at this time.  Discussed general exercises to help strengthen this.  We can consider formal physical therapy.  She was asked about a brace but at this point she is functioning well without any issues and I am hesitant to brace at this point.  Vivi Barrack DPM

## 2020-02-26 ENCOUNTER — Ambulatory Visit: Payer: 59 | Admitting: Neurology

## 2020-12-19 IMAGING — DX DG FOOT COMPLETE 3+V*R*
3 series · 3 of 3 positions shown · non-contrast
Comparison: Left ankle radiographs 02/27/2011.

CLINICAL DATA: Bilateral foot pain.  Hammertoe.

EXAM:
LEFT FOOT - COMPLETE 3+ VIEW; RIGHT FOOT COMPLETE - 3+ VIEW

[foot ap]
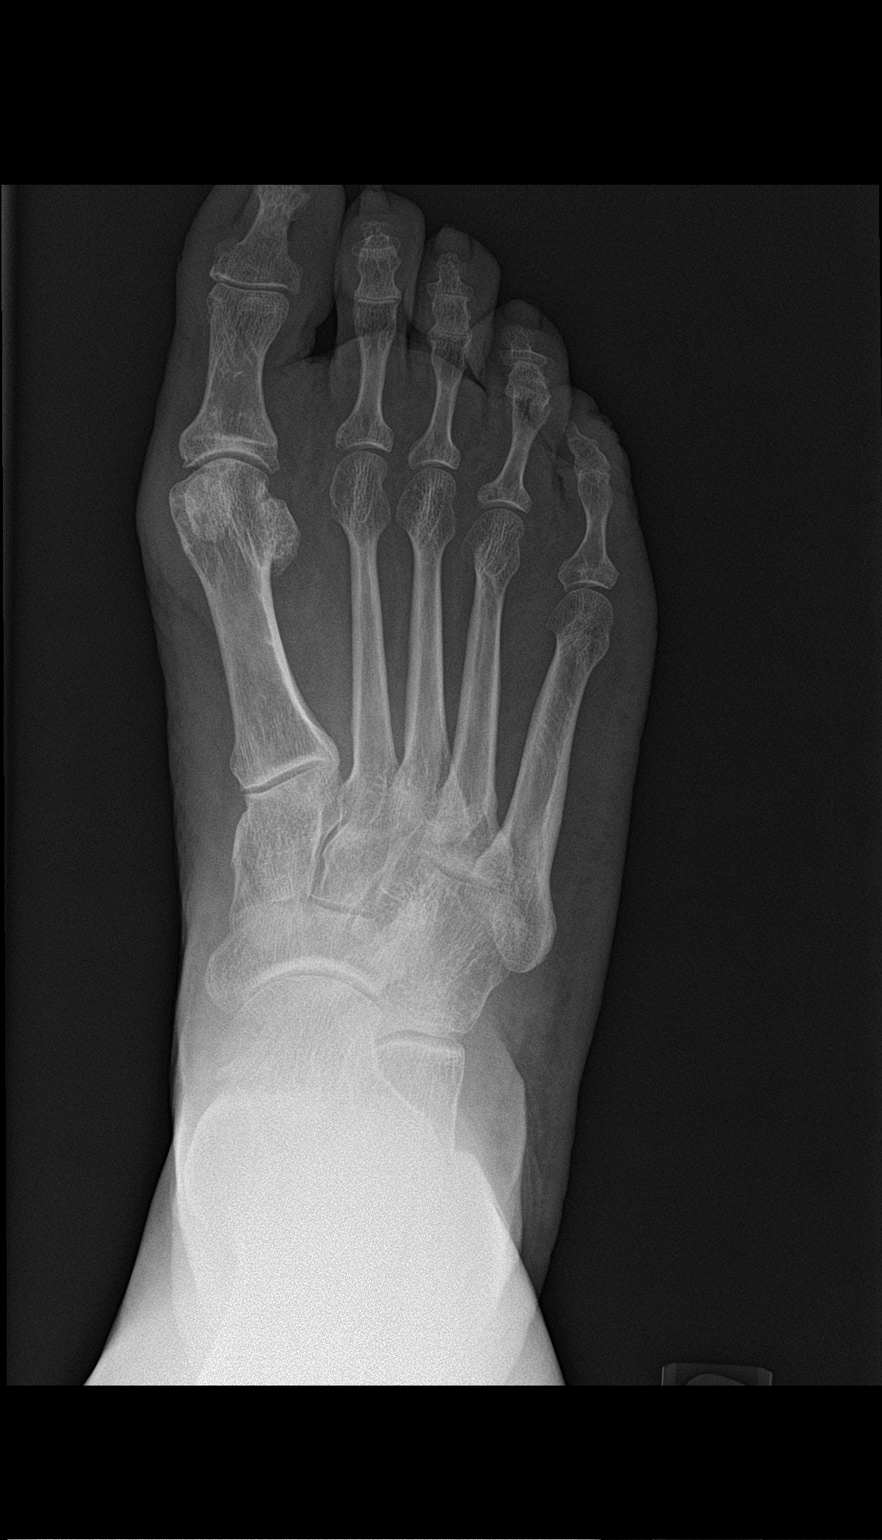

[foot obl]
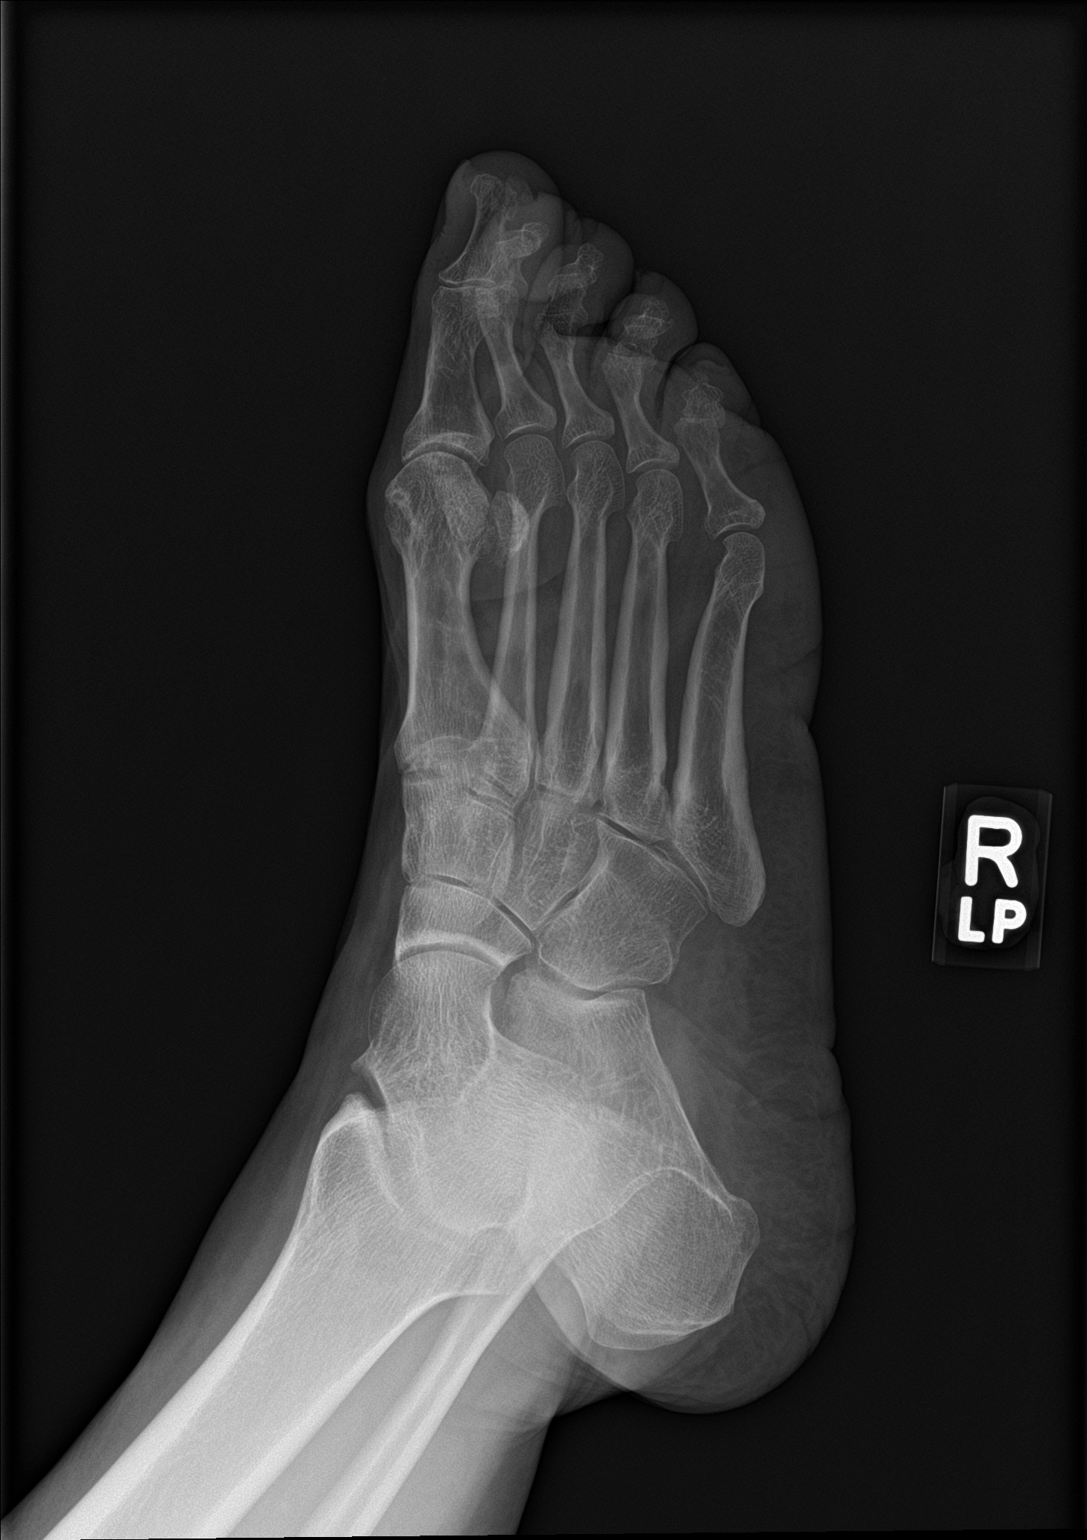

[foot lat]
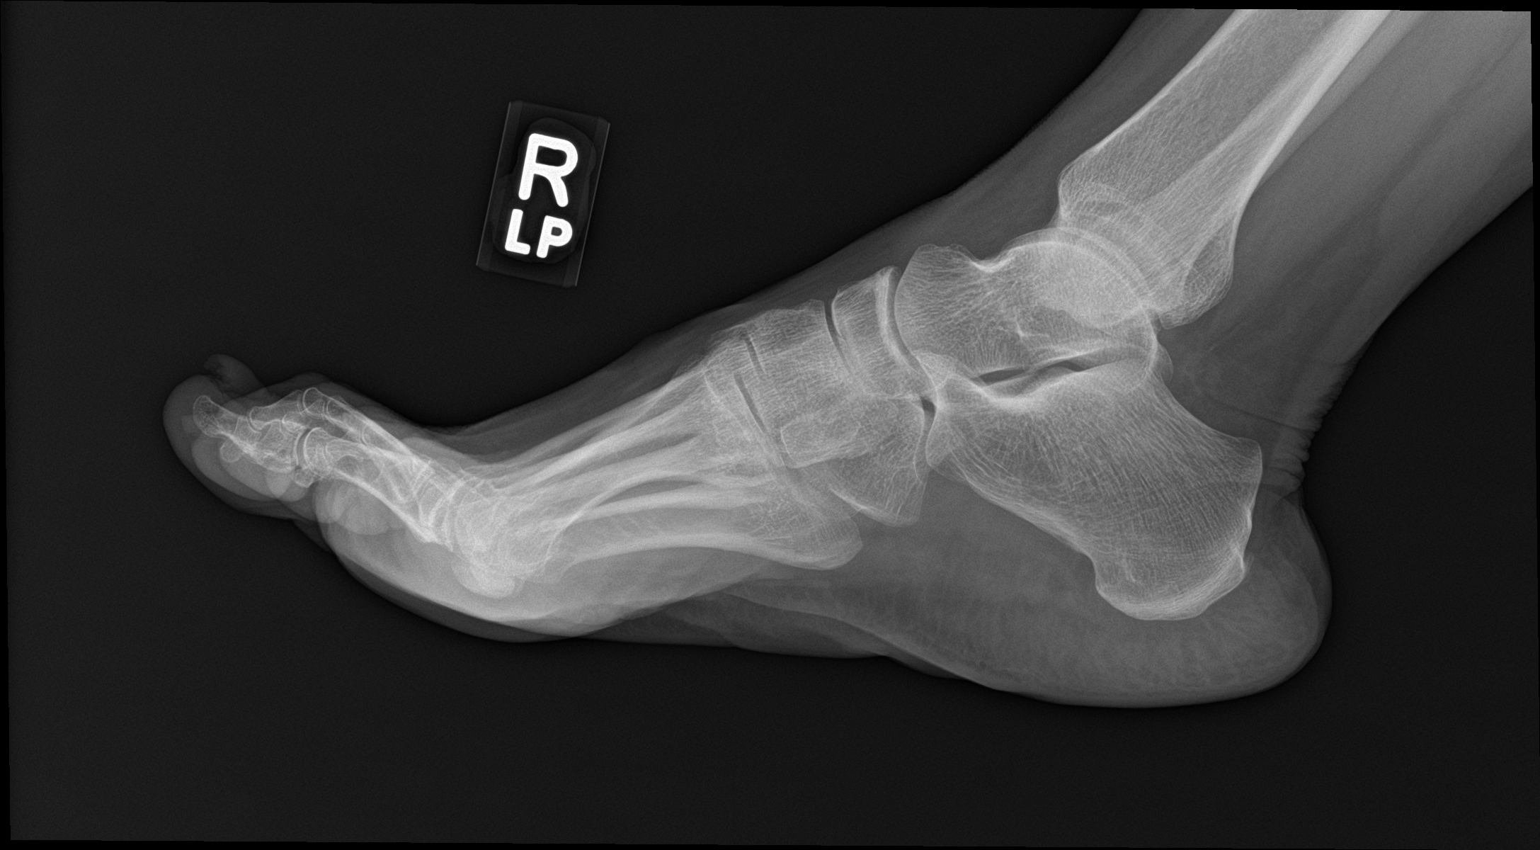

[3 of 3 positions shown; findings below may reference images not displayed]

FINDINGS: The bones appear mildly demineralized. There is no evidence of acute
fracture or dislocation. Mild-to-moderate degenerative changes are
present at the 1st metatarsophalangeal joints with associated hallux
valgus deformities. There are possible mild clawtoe deformities of
the 2nd through 5th digits. No erosive changes or focal soft tissue
abnormalities are identified.
IMPRESSION: No acute findings. 1st MTP degenerative changes and hallux valgus
deformities in both feet.

## 2021-01-06 ENCOUNTER — Telehealth: Payer: Self-pay | Admitting: Neurology

## 2021-01-06 NOTE — Telephone Encounter (Signed)
Spoken to patient and notified Dr Patel's comments. Verbalized understanding.   

## 2021-01-06 NOTE — Telephone Encounter (Signed)
Form completed and will be faxed.  Please keep follow-up in Sept since she last saw me in early 2021. Thanks.

## 2021-01-06 NOTE — Telephone Encounter (Signed)
Faxed as instructed

## 2021-01-06 NOTE — Telephone Encounter (Signed)
Patient scheduled at the end of August for surgery.   She scheduled a follow up appointment with Dr. Allena Katz on 02/21/21 and has been added to the wait list.  Patient wants to know if form can be completed without a visit first?

## 2021-01-06 NOTE — Telephone Encounter (Signed)
Pt is having a procedure done with [facility] and will need medical clearance. Does patel need to see her or will she just sign the paperwork?  They are supposed to send the paperwork over.

## 2021-01-07 ENCOUNTER — Telehealth: Payer: Self-pay | Admitting: Neurology

## 2021-01-07 NOTE — Telephone Encounter (Signed)
Spoken to patient and notified that I did fax the clearance to [facility] [place] yesterday afternoon. However, will re-fax again because patient was told it was going to be sent in an e-mail which is not the case.

## 2021-01-07 NOTE — Telephone Encounter (Signed)
Patient wants to make sure we have filled out the dental clearance form that was sent to our office please call   The form should have come from [facility] in [place]

## 2021-01-31 ENCOUNTER — Other Ambulatory Visit: Payer: Self-pay

## 2021-01-31 ENCOUNTER — Ambulatory Visit: Payer: 59 | Admitting: Neurology

## 2021-01-31 ENCOUNTER — Encounter: Payer: Self-pay | Admitting: Neurology

## 2021-01-31 VITALS — BP 107/69 | HR 77 | Ht 66.0 in | Wt 170.0 lb

## 2021-01-31 DIAGNOSIS — G6181 Chronic inflammatory demyelinating polyneuritis: Secondary | ICD-10-CM | POA: Diagnosis not present

## 2021-01-31 NOTE — Progress Notes (Signed)
    Follow-up Visit   Date: 01/31/21   Hannah Bowen MRN: [id number] DOB: 1961-03-09   Interim History: Hannah Bowen is a 60 y.o. right-handed female returning to the clinic for follow-up of CIDP (diagnosed 2011).  The patient was accompanied to the clinic by self.  She was last seen in the office in January 2021.  She has been doing very well over the past year and does not have any new neurological complaints.  Numbness remains in the feet.  No new weakness or imbalance. She is traveling and recently came back from [place]. She does recall having worsening tingling in the hands and feet when taking acetazolamide for attitude sickness.  She will be having extensive dental work next month and her dentist's office requested documentation if any specific precaution needs to be taken, which has been provided.  Medications:  Current Outpatient Medications on File Prior to Visit  Medication Sig Dispense Refill   ondansetron (ZOFRAN) 8 MG tablet Take 8 mg by mouth 3 (three) times daily as needed.     No current facility-administered medications on file prior to visit.    Allergies: No Known Allergies  Vital Signs:  BP 107/69   Pulse 77   Ht 5\' 6"  (1.676 m)   Wt 170 lb (77.1 kg)   SpO2 97%   BMI 27.44 kg/m    Neurological Exam: MENTAL STATUS including orientation to time, place, person, recent and remote memory, attention span and concentration, language, and fund of knowledge is normal.  Speech is not dysarthric.  CRANIAL NERVES:  No visual field defects.  Pupils equal round and reactive to light.  Normal conjugate, extra-ocular eye movements in all directions of gaze.  No ptosis.  Face is symmetric. Palate elevates symmetrically.  Tongue is midline.  MOTOR:  Motor strength is 5/5 in all extremities.  No atrophy, fasciculations or abnormal movements.  No pronator drift.  Tone is normal.    MSRs:  Reflexes are 2+/4 throughout, except 1+/4 bilateral ankles.  SENSORY:  Intact to  vibration, temperature, and pin prick throughout, including at the feet.  COORDINATION/GAIT:  Normal finger-to- nose-finger.  Intact rapid alternating movements bilaterally.  Gait narrow based and stable.  Tandem and stressed gait intact.   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  There is clear interval improvement in NCS amplitudes and velocities and EMG is also greatly improved with resolution of spontaneous activity compared with an outside study in 2011.  The marked change from earlier study strongly implies an acquired rather than hereditary neuropathy.   IMPRESSION/PLAN: Chronic inflammatory demyelinating polyradiculoneuropathy, diagnosed 2011 with exacerbation of sensory symptoms in summer 2020 and treated with solumedrol for 4 months.  NCS/EMG performed at [facility] in August 2020 which showed mild-moderate demyelinating neuropathy, improved amplitudes as compared to outside study in 2011.  She has mild paresthesias in the feet, which is stable.  Exam shows mildly reduced reflexes at the ankles and otherwise normal strength, sensation, and proprioception. Continue to monitor off steroids. No specific precautions necessary for upcoming dental surgery.  Return to clinic as needed  Thank you for allowing me to participate in patient's care.  If I can answer any additional questions, I would be pleased to do so.    Sincerely,    Dempsy Damiano K. 2012, DO

## 2021-02-21 ENCOUNTER — Ambulatory Visit: Payer: 59 | Admitting: Neurology

## 2022-12-08 ENCOUNTER — Telehealth: Payer: Self-pay | Admitting: Neurology

## 2022-12-08 ENCOUNTER — Other Ambulatory Visit: Payer: Self-pay | Admitting: Family Medicine

## 2022-12-08 DIAGNOSIS — E049 Nontoxic goiter, unspecified: Secondary | ICD-10-CM

## 2022-12-08 NOTE — Telephone Encounter (Signed)
Please let pt know that we do not use prednisone for altitude sickness.  Diamox has been prescribed by PCP which is appropriate.  I have discussed her care with PCP to be sure we are in agreement. All the best with her upcoming travels.

## 2022-12-08 NOTE — Telephone Encounter (Signed)
Patient is travelling and was recommended prednisone 50mg . It is high altitude. Her PCP was not excited about her being on the medication since she received it from neurologist. She wants to know how she should manage this. Looking for advice from Dr.patel

## 2022-12-09 ENCOUNTER — Ambulatory Visit
Admission: RE | Admit: 2022-12-09 | Discharge: 2022-12-09 | Disposition: A | Discharge status: Home / Self Care | Payer: No Typology Code available for payment source | Source: Ambulatory Visit | Attending: Family Medicine | Admitting: Family Medicine

## 2022-12-09 DIAGNOSIS — E049 Nontoxic goiter, unspecified: Secondary | ICD-10-CM

## 2022-12-09 NOTE — Telephone Encounter (Signed)
Called patient and informed her of Dr. Eliane Decree recommendations below. Patient verbalized understanding and thanked [place] for the call.

## 2023-09-10 ENCOUNTER — Encounter: Payer: Self-pay | Admitting: Gastroenterology

## 2023-10-05 ENCOUNTER — Encounter: Payer: Self-pay | Admitting: Gastroenterology
